# Patient Record
Sex: Female | Born: 1955
Health system: Southern US, Community
[De-identification: ages and names within clinical notes are randomized; demographics above are authoritative.]

## PROBLEM LIST (undated history)

## (undated) DIAGNOSIS — S83249A Other tear of medial meniscus, current injury, unspecified knee, initial encounter: Secondary | ICD-10-CM

## (undated) DIAGNOSIS — K219 Gastro-esophageal reflux disease without esophagitis: Secondary | ICD-10-CM

## (undated) DIAGNOSIS — C801 Malignant (primary) neoplasm, unspecified: Secondary | ICD-10-CM

## (undated) DIAGNOSIS — M8580 Other specified disorders of bone density and structure, unspecified site: Secondary | ICD-10-CM

## (undated) DIAGNOSIS — C8190 Hodgkin lymphoma, unspecified, unspecified site: Secondary | ICD-10-CM

## (undated) DIAGNOSIS — Z79899 Other long term (current) drug therapy: Secondary | ICD-10-CM

## (undated) DIAGNOSIS — M810 Age-related osteoporosis without current pathological fracture: Secondary | ICD-10-CM

## (undated) DIAGNOSIS — M1710 Unilateral primary osteoarthritis, unspecified knee: Secondary | ICD-10-CM

## (undated) DIAGNOSIS — J302 Other seasonal allergic rhinitis: Secondary | ICD-10-CM

## (undated) DIAGNOSIS — M25561 Pain in right knee: Secondary | ICD-10-CM

## (undated) DIAGNOSIS — R1310 Dysphagia, unspecified: Secondary | ICD-10-CM

## (undated) DIAGNOSIS — M545 Low back pain: Secondary | ICD-10-CM

## (undated) DIAGNOSIS — H6592 Unspecified nonsuppurative otitis media, left ear: Secondary | ICD-10-CM

## (undated) DIAGNOSIS — R079 Chest pain, unspecified: Secondary | ICD-10-CM

## (undated) HISTORY — DX: Dysphagia, unspecified: R13.10

## (undated) HISTORY — DX: Other seasonal allergic rhinitis: J30.2

## (undated) HISTORY — DX: Hodgkin lymphoma, unspecified, unspecified site: C81.90

## (undated) HISTORY — PX: KNEE ARTHROSCOPY: SUR90

## (undated) HISTORY — PX: SALIVARY GLAND SURGERY: SHX768

## (undated) HISTORY — PX: POLYPECTOMY: SHX149

## (undated) HISTORY — PX: NECK LESION BIOPSY: SHX2078

## (undated) HISTORY — DX: Chest pain, unspecified: R07.9

## (undated) HISTORY — DX: Other tear of medial meniscus, current injury, unspecified knee, initial encounter: S83.249A

## (undated) HISTORY — DX: Low back pain: M54.5

## (undated) HISTORY — DX: Unilateral primary osteoarthritis, unspecified knee: M17.10

## (undated) HISTORY — DX: Pain in right knee: M25.561

## (undated) HISTORY — PX: CHOLECYSTECTOMY: SHX55

## (undated) HISTORY — DX: Other long term (current) drug therapy: Z79.899

## (undated) HISTORY — DX: Unspecified nonsuppurative otitis media, left ear: H65.92

## (undated) HISTORY — DX: Age-related osteoporosis without current pathological fracture: M81.0

## (undated) HISTORY — DX: Gastro-esophageal reflux disease without esophagitis: K21.9

## (undated) HISTORY — DX: Other specified disorders of bone density and structure, unspecified site: M85.80

---
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
## 1999-09-06 ENCOUNTER — Other Ambulatory Visit: Admission: RE | Admit: 1999-09-06 | Discharge: 1999-09-06 | Payer: Self-pay | Admitting: Family Medicine

## 2000-04-09 ENCOUNTER — Ambulatory Visit (HOSPITAL_BASED_OUTPATIENT_CLINIC_OR_DEPARTMENT_OTHER): Admission: RE | Admit: 2000-04-09 | Discharge: 2000-04-10 | Payer: Self-pay | Admitting: *Deleted

## 2004-10-29 ENCOUNTER — Ambulatory Visit: Payer: Self-pay | Admitting: Family Medicine

## 2013-11-26 HISTORY — PX: KNEE ARTHROSCOPY W/ SYNOVECTOMY: SHX1887

## 2015-11-21 DIAGNOSIS — R350 Frequency of micturition: Secondary | ICD-10-CM | POA: Diagnosis not present

## 2015-11-21 DIAGNOSIS — Z1231 Encounter for screening mammogram for malignant neoplasm of breast: Secondary | ICD-10-CM | POA: Diagnosis not present

## 2015-11-21 DIAGNOSIS — M8588 Other specified disorders of bone density and structure, other site: Secondary | ICD-10-CM | POA: Diagnosis not present

## 2015-11-21 DIAGNOSIS — Z01419 Encounter for gynecological examination (general) (routine) without abnormal findings: Secondary | ICD-10-CM | POA: Diagnosis not present

## 2015-11-22 DIAGNOSIS — Z0000 Encounter for general adult medical examination without abnormal findings: Secondary | ICD-10-CM | POA: Diagnosis not present

## 2016-07-24 DIAGNOSIS — H6592 Unspecified nonsuppurative otitis media, left ear: Secondary | ICD-10-CM | POA: Diagnosis not present

## 2017-04-01 DIAGNOSIS — Z1231 Encounter for screening mammogram for malignant neoplasm of breast: Secondary | ICD-10-CM | POA: Diagnosis not present

## 2017-04-01 DIAGNOSIS — N952 Postmenopausal atrophic vaginitis: Secondary | ICD-10-CM | POA: Diagnosis not present

## 2017-04-01 DIAGNOSIS — Z01419 Encounter for gynecological examination (general) (routine) without abnormal findings: Secondary | ICD-10-CM | POA: Diagnosis not present

## 2017-04-01 DIAGNOSIS — N905 Atrophy of vulva: Secondary | ICD-10-CM | POA: Diagnosis not present

## 2017-04-01 DIAGNOSIS — M8580 Other specified disorders of bone density and structure, unspecified site: Secondary | ICD-10-CM | POA: Diagnosis not present

## 2017-05-20 DIAGNOSIS — Z780 Asymptomatic menopausal state: Secondary | ICD-10-CM | POA: Diagnosis not present

## 2017-05-20 DIAGNOSIS — M810 Age-related osteoporosis without current pathological fracture: Secondary | ICD-10-CM | POA: Diagnosis not present

## 2017-05-20 DIAGNOSIS — M8588 Other specified disorders of bone density and structure, other site: Secondary | ICD-10-CM | POA: Diagnosis not present

## 2017-05-21 DIAGNOSIS — M546 Pain in thoracic spine: Secondary | ICD-10-CM | POA: Diagnosis not present

## 2017-05-21 DIAGNOSIS — R030 Elevated blood-pressure reading, without diagnosis of hypertension: Secondary | ICD-10-CM | POA: Diagnosis not present

## 2017-05-22 ENCOUNTER — Encounter: Payer: Self-pay | Admitting: Emergency Medicine

## 2017-05-22 ENCOUNTER — Emergency Department (HOSPITAL_COMMUNITY)
Admission: EM | Admit: 2017-05-22 | Discharge: 2017-05-23 | Disposition: A | Discharge status: Home / Self Care | Payer: BLUE CROSS/BLUE SHIELD | Attending: Emergency Medicine | Admitting: Emergency Medicine

## 2017-05-22 ENCOUNTER — Emergency Department (HOSPITAL_COMMUNITY): Payer: BLUE CROSS/BLUE SHIELD

## 2017-05-22 DIAGNOSIS — M8580 Other specified disorders of bone density and structure, unspecified site: Secondary | ICD-10-CM

## 2017-05-22 DIAGNOSIS — Z79899 Other long term (current) drug therapy: Secondary | ICD-10-CM | POA: Diagnosis not present

## 2017-05-22 DIAGNOSIS — R079 Chest pain, unspecified: Secondary | ICD-10-CM | POA: Insufficient documentation

## 2017-05-22 DIAGNOSIS — Z87891 Personal history of nicotine dependence: Secondary | ICD-10-CM | POA: Diagnosis not present

## 2017-05-22 DIAGNOSIS — Z7982 Long term (current) use of aspirin: Secondary | ICD-10-CM | POA: Diagnosis not present

## 2017-05-22 DIAGNOSIS — R0789 Other chest pain: Secondary | ICD-10-CM | POA: Diagnosis not present

## 2017-05-22 HISTORY — DX: Other specified disorders of bone density and structure, unspecified site: M85.80

## 2017-05-22 HISTORY — DX: Malignant (primary) neoplasm, unspecified: C80.1

## 2017-05-22 LAB — CBC
HCT: 39.7 % (ref 36.0–46.0)
Hemoglobin: 13.3 g/dL (ref 12.0–15.0)
MCH: 30 pg (ref 26.0–34.0)
MCHC: 33.5 g/dL (ref 30.0–36.0)
MCV: 89.6 fL (ref 78.0–100.0)
Platelets: 205 10*3/uL (ref 150–400)
RBC: 4.43 MIL/uL (ref 3.87–5.11)
RDW: 14.9 % (ref 11.5–15.5)
WBC: 7.3 10*3/uL (ref 4.0–10.5)

## 2017-05-22 LAB — BASIC METABOLIC PANEL
Anion gap: 8 (ref 5–15)
BUN: 8 mg/dL (ref 6–20)
CO2: 27 mmol/L (ref 22–32)
Calcium: 9.4 mg/dL (ref 8.9–10.3)
Chloride: 103 mmol/L (ref 101–111)
Creatinine, Ser: 0.81 mg/dL (ref 0.44–1.00)
GFR calc Af Amer: >60 mL/min (ref >60)
GFR calc non Af Amer: >60 mL/min (ref >60)
Glucose, Bld: 87 mg/dL (ref 65–99)
Potassium: 3.9 mmol/L (ref 3.5–5.1)
Sodium: 138 mmol/L (ref 135–145)

## 2017-05-22 LAB — I-STAT TROPONIN, ED
Troponin i, poc: 0 ng/mL (ref 0.00–0.08)
Troponin i, poc: 0 ng/mL (ref 0.00–0.08)

## 2017-05-22 MED ORDER — HYDROXYZINE HCL 25 MG PO TABS: 25.0000 mg | ORAL_TABLET | Freq: Four times a day (QID) | ORAL | 0 refills | Status: AC | PRN

## 2017-05-22 MED ORDER — HYDROXYZINE HCL 25 MG PO TABS
25.0000 mg | ORAL_TABLET | Freq: Once | ORAL | Status: AC
  Administered 2017-05-22: 25 mg via ORAL
  Filled 2017-05-22: qty 1

## 2017-05-22 NOTE — ED Triage Notes (Signed)
Pt arrives via [facility] from UC; pt at [facility] for back pain x 3 weeks; pt also mention she had chest pain x 3 weeks but denies chest pain on arrival to UC and on arrival to ED; Per  EMS UC saw inverted T waves on EKG and suggested she come to ED;pt arrives a&ox 4; family present on arrival. Pt received 324 mg ASA at [facility]

## 2017-05-22 NOTE — ED Notes (Signed)
ED Provider at bedside. 

## 2017-05-22 NOTE — ED Provider Notes (Signed)
[facility] DEPT Provider Note   CSN: [id number] Arrival date & time: 05/22/17  1946     History   Chief Complaint Chief Complaint  Patient presents with  . Back Pain  . Chest Pain    HPI Linda Tanner is a 61 y.o. female.  61 year old female hiistory of Hodgkin's disease who presents after being seen earlier at [facility] urgent care for chest pain.  Patient describes 3 weeks of intermittent chest pain with associated upper back pain.  She denies any previous MI or cardiac stents.  No family history of cardiac disease before age 24.  She denies ever smoking.  She states she has been under a lot of stress lately with multiple sick family and friends.  She feels her stress is strongly contributory to her chest pain. She discussed with her sister, who recently underwent a left heart cath at age 59.  Sister recommended patient be evaluated.  Patient went to a funeral earlier today before being seen at urgent care.  At urgent care, she was noted to have inverted T waves without prior comparison.  She was instructed to present to ED for further evaluation.  She currently endorses mild left posterior scapular pain that radiates to her substernal region.  The history is provided by the patient, the spouse and medical records. No language interpreter was used.    Past Medical History:  Diagnosis Date  . Cancer ([place]) 1972   hodgkins    There are no active problems to display for this patient.   History reviewed. No pertinent surgical history.  OB History    No data available       Home Medications    Prior to Admission medications   Medication Sig Start Date End Date Taking? Authorizing Provider  aspirin EC 81 MG tablet Take 81 mg by mouth daily.   Yes [provider]  calcium carbonate (CALCIUM 600) 600 MG TABS tablet Take 1,200 mg by mouth 2 (two) times daily with a meal.   Yes [provider]  Chlorphen-Phenyleph-Ibuprofen (ADVIL ALLERGY & CONGESTION)  4-10-200 MG TABS Take 1 tablet by mouth daily as needed (congestion).   Yes [provider]  fexofenadine (ALLEGRA) 30 MG tablet Take 30 mg by mouth daily.   Yes [provider]  Multiple Vitamin (MULTIVITAMIN) tablet Take 1 tablet by mouth daily.   Yes [provider]  omega-3 acid ethyl esters (LOVAZA) 1 g capsule Take 1 g by mouth daily.   Yes [provider]  Potassium (POTASSIMIN PO) Take 1 tablet by mouth daily.   Yes [provider]  TURMERIC PO Take 1 tablet by mouth daily.   Yes [provider]  hydrOXYzine (ATARAX/VISTARIL) 25 MG tablet Take 1 tablet (25 mg total) by mouth every 6 (six) hours as needed for anxiety. 05/22/17   [name], MD    Family History No family history on file.  Social History Social History  Substance Use Topics  . Smoking status: Former Research scientist (life sciences)  . Smokeless tobacco: Not on file  . Alcohol use No     Allergies   Compazine [prochlorperazine edisylate] and Morphine and related   Review of Systems Review of Systems  Constitutional: Negative for chills and fever.  HENT: Negative for ear pain and sore throat.   Eyes: Negative for pain and visual disturbance.  Respiratory: Negative for cough and shortness of breath.   Cardiovascular: Positive for chest pain. Negative for palpitations.  Gastrointestinal: Negative for abdominal pain and  vomiting.  Genitourinary: Negative for dysuria and hematuria.  Musculoskeletal: Positive for back pain. Negative for arthralgias.  Skin: Negative for color change and rash.  Neurological: Negative for seizures and syncope.  All other systems reviewed and are negative.    Physical Exam Updated Vital Signs BP (!) 150/82   Pulse (!) 48   Temp 97.6 F (36.4 C) (Oral)   Resp 11   Ht 5\' 3"  (1.6 m)   Wt 79.5 kg (175 lb 4 oz)   SpO2 100%   BMI 31.04 kg/m   Physical Exam  Constitutional: She appears well-developed and well-nourished. No distress.  HENT:    Head: Normocephalic and atraumatic.  Eyes: Conjunctivae are normal.  Neck: Neck supple.  Cardiovascular: Normal rate and regular rhythm.   No murmur heard. Pulmonary/Chest: Effort normal and breath sounds normal. No respiratory distress. She has no wheezes. She has no rales. She exhibits tenderness (Mild reproducible anterior chest pain).  Abdominal: Soft. There is no tenderness.  Musculoskeletal: She exhibits no edema.  Neurological: She is alert. No cranial nerve deficit. Coordination normal.  5/5 motor strength and intact sensation in all extremities. Finger-to-nose intact bilaterally  Skin: Skin is warm and dry.  Nursing note and vitals reviewed.    ED Treatments / Results  Labs (all labs ordered are listed, but only abnormal results are displayed) Labs Reviewed  BASIC METABOLIC PANEL  CBC  I-STAT TROPONIN, ED  I-STAT TROPONIN, ED    EKG  EKG Interpretation  Date/Time:  Thursday May 22 2017 19:54:18 EDT Ventricular Rate:  59 PR Interval:  168 QRS Duration: 92 QT Interval:  434 QTC Calculation: 429 R Axis:   52 Text Interpretation:  Sinus bradycardia Septal infarct , age undetermined Abnormal ECG No previous ECGs available Confirmed by Wandra Arthurs ([id number]) on 05/22/2017 10:01:49 PM       Radiology Dg Chest 2 View  Result Date: 05/22/2017 CLINICAL DATA:  Chest pain, dizzy with heavy chest feeling for 3 weeks EXAM: CHEST  2 VIEW COMPARISON:  None FINDINGS: Borderline enlargement of cardiac silhouette. Mediastinal contours and pulmonary vascularity normal. Lungs clear. No pleural effusion or pneumothorax. Bones unremarkable. IMPRESSION: No acute abnormalities. Electronically Signed   By: Lavonia Dana M.D.   On: 05/22/2017 20:42    Procedures Procedures (including critical care time)  Medications Ordered in ED Medications  hydrOXYzine (ATARAX/VISTARIL) tablet 25 mg (25 mg Oral Given 05/22/17 2319)     Initial Impression / Assessment and Plan / ED Course  I  have reviewed the triage vital signs and the nursing notes.  Pertinent labs & imaging results that were available during my care of the patient were reviewed by me and considered in my medical decision making (see chart for details).     61 year old female who presents after earlier evaluation at urgent care for low risk chest pain. HEAR score of 3.  Multiple significant stressors likely contributing to symptoms.  EKG showing sinus bradycardia and septal infarct.  Nonspecific T wave inversions without previous comparison EKGs.  Troponins undetectable x2.  CXR showing no acute abnormalities.  CBC, BMP WNL.  Doubt ACS, aortic dissection, PE, Boerhaave's, PTX, pneumonia. Patient stable for close PCP and counselor/therapist follow-up.  Return precautions provided for worsening symptoms. Pt will f/u with PCP at first availability. Pt verbalized agreement with plan.  Pt care d/w Dr. Darl Householder  Final Clinical Impressions(s) / ED Diagnoses   Final diagnoses:  Chest pain, unspecified type    New Prescriptions Discharge Medication List  as of 05/22/2017 11:56 PM    START taking these medications   Details  hydrOXYzine (ATARAX/VISTARIL) 25 MG tablet Take 1 tablet (25 mg total) by mouth every 6 (six) hours as needed for anxiety., Starting Thu 05/22/2017, Print         [name], MD 05/23/17 [id number]    Drenda Freeze, MD 05/26/17 [id number]

## 2017-05-22 NOTE — Discharge Instructions (Addendum)
Please followup with primary doctor at first availability. Return to ED for worsening symptoms.

## 2017-05-26 DIAGNOSIS — R072 Precordial pain: Secondary | ICD-10-CM | POA: Diagnosis not present

## 2017-05-26 DIAGNOSIS — F430 Acute stress reaction: Secondary | ICD-10-CM | POA: Diagnosis not present

## 2017-05-26 DIAGNOSIS — C8190 Hodgkin lymphoma, unspecified, unspecified site: Secondary | ICD-10-CM | POA: Diagnosis not present

## 2017-05-26 DIAGNOSIS — R05 Cough: Secondary | ICD-10-CM | POA: Diagnosis not present

## 2017-05-27 DIAGNOSIS — M25561 Pain in right knee: Secondary | ICD-10-CM

## 2017-05-27 DIAGNOSIS — R0789 Other chest pain: Secondary | ICD-10-CM | POA: Insufficient documentation

## 2017-05-27 DIAGNOSIS — R071 Chest pain on breathing: Secondary | ICD-10-CM

## 2017-05-27 DIAGNOSIS — M545 Low back pain, unspecified: Secondary | ICD-10-CM

## 2017-05-27 DIAGNOSIS — R079 Chest pain, unspecified: Secondary | ICD-10-CM

## 2017-05-27 DIAGNOSIS — M810 Age-related osteoporosis without current pathological fracture: Secondary | ICD-10-CM

## 2017-05-27 DIAGNOSIS — H6592 Unspecified nonsuppurative otitis media, left ear: Secondary | ICD-10-CM

## 2017-05-27 DIAGNOSIS — S83249A Other tear of medial meniscus, current injury, unspecified knee, initial encounter: Secondary | ICD-10-CM

## 2017-05-27 DIAGNOSIS — M179 Osteoarthritis of knee, unspecified: Secondary | ICD-10-CM

## 2017-05-27 DIAGNOSIS — C8190 Hodgkin lymphoma, unspecified, unspecified site: Secondary | ICD-10-CM | POA: Insufficient documentation

## 2017-05-27 DIAGNOSIS — M1710 Unilateral primary osteoarthritis, unspecified knee: Secondary | ICD-10-CM

## 2017-05-27 HISTORY — DX: Hodgkin lymphoma, unspecified, unspecified site: C81.90

## 2017-05-27 HISTORY — DX: Osteoarthritis of knee, unspecified: M17.9

## 2017-05-27 HISTORY — DX: Unilateral primary osteoarthritis, unspecified knee: M17.10

## 2017-05-27 HISTORY — DX: Age-related osteoporosis without current pathological fracture: M81.0

## 2017-05-27 HISTORY — DX: Unspecified nonsuppurative otitis media, left ear: H65.92

## 2017-05-27 HISTORY — DX: Pain in right knee: M25.561

## 2017-05-27 HISTORY — DX: Chest pain, unspecified: R07.9

## 2017-05-27 HISTORY — DX: Other tear of medial meniscus, current injury, unspecified knee, initial encounter: S83.249A

## 2017-05-27 HISTORY — DX: Low back pain, unspecified: M54.50
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
## 2017-05-30 ENCOUNTER — Ambulatory Visit (INDEPENDENT_AMBULATORY_CARE_PROVIDER_SITE_OTHER): Payer: BLUE CROSS/BLUE SHIELD | Admitting: Cardiology

## 2017-05-30 ENCOUNTER — Encounter: Payer: Self-pay | Admitting: Cardiology

## 2017-05-30 VITALS — BP 160/98 | HR 72 | Ht 63.0 in | Wt 176.0 lb

## 2017-05-30 DIAGNOSIS — R079 Chest pain, unspecified: Secondary | ICD-10-CM | POA: Diagnosis not present

## 2017-05-30 DIAGNOSIS — C8190 Hodgkin lymphoma, unspecified, unspecified site: Secondary | ICD-10-CM

## 2017-05-30 MED ORDER — NITROGLYCERIN 0.4 MG SL SUBL: 0.4000 mg | SUBLINGUAL_TABLET | SUBLINGUAL | 11 refills | Status: DC | PRN

## 2017-05-30 MED ORDER — NITROGLYCERIN 0.3 MG SL SUBL: 0.4000 mg | SUBLINGUAL_TABLET | SUBLINGUAL | Status: DC | PRN

## 2017-05-30 MED ORDER — DILTIAZEM HCL 30 MG PO TABS: 30.0000 mg | ORAL_TABLET | Freq: Three times a day (TID) | ORAL | 3 refills | Status: DC

## 2017-05-30 NOTE — Addendum Note (Signed)
Addended by: Stevan Born on: 05/30/2017 04:37 PM   Modules accepted: Orders

## 2017-05-30 NOTE — Patient Instructions (Addendum)
Medication Instructions:  Your physician has recommended you make the following change in your medication:  START diltiazem (Cardizem) 30 mg three times daily START nitroglycerin 0.4 mg sublingual (under your tongue) as needed for chest pain. When having chest pain, stop what you are doing and sit down. Take 1 nitro, wait 5 minutes. Still having chest pain, take 1 nitro, wait 5 minutes. Still having chest pain, take 1 nitro, dial 911. Total of 3 nitro in 15 minutes.  Labwork: None  Testing/Procedures: You had an EKG today.   Your physician has requested that you have en exercise stress myoview. For further information please visit [email or web address]. Please follow instruction sheet, as given.  Follow-Up: Your physician recommends that you schedule a follow-up appointment in: 2 weeks.  Any Other Special Instructions Will Be Listed Below (If Applicable).     If you need a refill on your cardiac medications before your next appointment, please call your pharmacy.    Angina Pectoris Angina pectoris, often called angina, is extreme discomfort in the chest, neck, or arm. This is caused by a lack of blood in the middle and thickest layer of the heart wall (myocardium). There are four types of angina:  Stable angina. Stable angina usually occurs in episodes of predictable frequency and duration. It is usually brought on by physical activity, stress, or excitement. Stable angina usually lasts a few minutes and can often be relieved by a medicine that you place under your tongue. This medicine is called sublingual nitroglycerin.  Unstable angina. Unstable angina can occur even when you are doing little or no physical activity. It can even occur while you are sleeping or when you are at rest. It can suddenly increase in severity or frequency. It may not be relieved by sublingual nitroglycerin, and it can last up to 30 minutes.  Microvascular angina. This type of angina is caused by a disorder  of tiny blood vessels called arterioles. Microvascular angina is more common in women. The pain may be more severe and last longer than other types of angina pectoris.  Prinzmetal or variant angina. This type of angina pectoris is rare and usually occurs when you are doing little or no physical activity. It especially occurs in the early morning hours.  What are the causes? Atherosclerosis is the cause of angina. This is the buildup of fat and cholesterol (plaque) on the inside of the arteries. Over time, the plaque may narrow or block the artery, and this will lessen blood flow to the heart. Plaque can also become weak and break off within a coronary artery to form a clot and cause a sudden blockage. What increases the risk? Risk factors common to both men and women include:  High cholesterol levels.  High blood pressure (hypertension).  Tobacco use.  Diabetes.  Family history of angina.  Obesity.  Lack of exercise.  A diet high in saturated fats.  Women are at greater risk for angina if they are:  Over age 80.  Postmenopausal.  What are the signs or symptoms? Many people do not experience any symptoms during the early stages of angina. As the condition progresses, symptoms common to both men and women may include:  Chest pain. ? The pain can be described as a crushing or squeezing in the chest, or a tightness, pressure, fullness, or heaviness in the chest. ? The pain can last more than a few minutes, or it can stop and recur.  Pain in the arms, neck, jaw, or back.  Unexplained heartburn or indigestion.  Shortness of breath.  Nausea.  Sudden cold sweats.  Sudden light-headedness.  Many women have chest discomfort and some of the other symptoms. However, women often have different (atypical) symptoms, such as:  Fatigue.  Unexplained feelings of nervousness or anxiety.  Unexplained weakness.  Dizziness or fainting.  Sometimes, women may have angina without  any symptoms. How is this diagnosed? Tests to diagnose angina may include:  ECG (electrocardiogram).  Exercise stress test. This looks for signs of blockage when the heart is being exercised.  Pharmacologic stress test. This test looks for signs of blockage when the heart is being stressed with a medicine.  Blood tests.  Coronary angiogram. This is a procedure to look at the coronary arteries to see if there is any blockage.  How is this treated? The treatment of angina may include the following:  Healthy behavioral changes to reduce or control risk factors.  Medicine.  Coronary stenting.A stent helps to keep an artery open.  Coronary angioplasty. This procedure widens a narrowed or blocked artery.  Coronary arterybypass surgery. This will allow your blood to pass the blockage (bypass) to reach your heart.  Follow these instructions at home:  Take medicines only as directed by your health care provider.  Do not take the following medicines unless your health care provider approves: ? Nonsteroidal anti-inflammatory drugs (NSAIDs), such as ibuprofen, naproxen, or celecoxib. ? Vitamin supplements that contain vitamin A, vitamin E, or both. ? Hormone replacement therapy that contains estrogen with or without progestin.  Manage other health conditions such as hypertension and diabetes as directed by your health care provider.  Follow a heart-healthy diet. A dietitian can help to educate you about healthy food options and changes.  Use healthy cooking methods such as roasting, grilling, broiling, baking, poaching, steaming, or stir-frying. Talk to a dietitian to learn more about healthy cooking methods.  Follow an exercise program approved by your health care provider.  Maintain a healthy weight. Lose weight as approved by your health care provider.  Plan rest periods when fatigued.  Learn to manage stress.  Do not use any tobacco products, including cigarettes, chewing  tobacco, or electronic cigarettes. If you need help quitting, ask your health care provider.  If you drink alcohol, and your health care provider approves, limit your alcohol intake to no more than 1 drink per day. One drink equals 12 ounces of beer, 5 ounces of wine, or 1 ounces of hard liquor.  Stop illegal drug use.  Keep all follow-up visits as directed by your health care provider. This is important. Get help right away if:  You have pain in your chest, neck, arm, jaw, stomach, or back that lasts more than a few minutes, is recurring, or is unrelieved by taking sublingualnitroglycerin.  You have profuse sweating without cause.  You have unexplained: ? Heartburn or indigestion. ? Shortness of breath or difficulty breathing. ? Nausea or vomiting. ? Fatigue. ? Feelings of nervousness or anxiety. ? Weakness. ? Diarrhea.  You have sudden light-headedness or dizziness.  You faint. These symptoms may represent a serious problem that is an emergency. Do not wait to see if the symptoms will go away. Get medical help right away. Call your local emergency services (911 in the [place] Do not drive yourself to the hospital. This information is not intended to replace advice given to you by your health care provider. Make sure you discuss any questions you have with your health care provider. Document Released:  07/29/2005 Document Revised: 01/10/2016 Document Reviewed: 11/30/2013 Elsevier Interactive Patient Education  2017 [email or web address]  Cholesterol Cholesterol is a fat. Your body needs a small amount of cholesterol. Cholesterol (plaque) may build up in your blood vessels (arteries). That makes you more likely to have a heart attack or stroke. You cannot feel your cholesterol level. Having a blood test is the only way to find out if your level is high. Keep your test results. Work with your doctor to keep your cholesterol at a good level. What do the results mean?  Total cholesterol  is how much cholesterol is in your blood.  LDL is bad cholesterol. This is the type that can build up. Try to have low LDL.  HDL is good cholesterol. It cleans your blood vessels and carries LDL away. Try to have high HDL.  Triglycerides are fat that the body can store or burn for energy. What are good levels of cholesterol?  Total cholesterol below 200.  LDL below 100 is good for people who have health risks. LDL below 70 is good for people who have very high risks.  HDL above 40 is good. It is best to have HDL of 60 or higher.  Triglycerides below 150. How can I lower my cholesterol? Diet Follow your diet program as told by your doctor.  Choose fish, white meat chicken, or [place] that is roasted or baked. Try not to eat red meat, fried foods, sausage, or lunch meats.  Eat lots of fresh fruits and vegetables.  Choose whole grains, beans, pasta, potatoes, and cereals.  Choose olive oil, corn oil, or canola oil. Only use small amounts.  Try not to eat butter, mayonnaise, shortening, or palm kernel oils.  Try not to eat foods with trans fats.  Choose low-fat or nonfat dairy foods. ? Drink skim or nonfat milk. ? Eat low-fat or nonfat yogurt and cheeses. ? Try not to drink whole milk or cream. ? Try not to eat ice cream, egg yolks, or full-fat cheeses.  Healthy desserts include angel food cake, ginger snaps, animal crackers, hard candy, popsicles, and low-fat or nonfat frozen yogurt. Try not to eat pastries, cakes, pies, and cookies.  Exercise Follow your exercise program as told by your doctor.  Be more active. Try gardening, walking, and taking the stairs.  Ask your doctor about ways that you can be more active.  Medicine  Take over-the-counter and prescription medicines only as told by your doctor.  This information is not intended to replace advice given to you by your health care provider. Make sure you discuss any questions you have with your health care  provider. Document Released: 10/25/2008 Document Revised: 02/28/2016 Document Reviewed: 02/08/2016 Elsevier Interactive Patient Education  2017 [employer].

## 2017-06-02 ENCOUNTER — Ambulatory Visit (HOSPITAL_COMMUNITY): Payer: BLUE CROSS/BLUE SHIELD | Attending: Cardiology

## 2017-06-02 VITALS — Ht 63.0 in | Wt 176.0 lb

## 2017-06-02 DIAGNOSIS — R079 Chest pain, unspecified: Secondary | ICD-10-CM | POA: Diagnosis not present

## 2017-06-02 DIAGNOSIS — R9431 Abnormal electrocardiogram [ECG] [EKG]: Secondary | ICD-10-CM

## 2017-06-02 LAB — MYOCARDIAL PERFUSION IMAGING
LV dias vol: 84 mL (ref 46–106)
LV sys vol: 23 mL
Peak HR: 85 {beats}/min
RATE: 0.3
Rest HR: 59 {beats}/min
SDS: 3
SRS: 2
SSS: 5
TID: 0.95

## 2017-06-02 MED ORDER — TECHNETIUM TC 99M TETROFOSMIN IV KIT
31.7000 | PACK | Freq: Once | INTRAVENOUS | Status: AC | PRN
  Administered 2017-06-02: 31.7 via INTRAVENOUS
  Filled 2017-06-02: qty 32

## 2017-06-02 MED ORDER — TECHNETIUM TC 99M TETROFOSMIN IV KIT
9.6000 | PACK | Freq: Once | INTRAVENOUS | Status: AC | PRN
  Administered 2017-06-02: 9.6 via INTRAVENOUS
  Filled 2017-06-02: qty 10

## 2017-06-02 MED ORDER — REGADENOSON 0.4 MG/5ML IV SOLN
0.4000 mg | Freq: Once | INTRAVENOUS | Status: AC
  Administered 2017-06-02: 0.4 mg via INTRAVENOUS

## 2017-06-12 DIAGNOSIS — Z1211 Encounter for screening for malignant neoplasm of colon: Secondary | ICD-10-CM | POA: Diagnosis not present

## 2017-06-12 DIAGNOSIS — Z23 Encounter for immunization: Secondary | ICD-10-CM | POA: Diagnosis not present

## 2017-06-12 DIAGNOSIS — Z1389 Encounter for screening for other disorder: Secondary | ICD-10-CM | POA: Diagnosis not present

## 2017-06-12 DIAGNOSIS — E669 Obesity, unspecified: Secondary | ICD-10-CM | POA: Diagnosis not present

## 2017-06-12 DIAGNOSIS — Z0000 Encounter for general adult medical examination without abnormal findings: Secondary | ICD-10-CM | POA: Diagnosis not present

## 2017-06-12 DIAGNOSIS — Z1331 Encounter for screening for depression: Secondary | ICD-10-CM | POA: Diagnosis not present
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
## 2017-06-13 ENCOUNTER — Encounter: Payer: Self-pay | Admitting: Cardiology

## 2017-06-13 ENCOUNTER — Ambulatory Visit (INDEPENDENT_AMBULATORY_CARE_PROVIDER_SITE_OTHER): Payer: BLUE CROSS/BLUE SHIELD | Admitting: Cardiology

## 2017-06-13 VITALS — BP 140/70 | HR 67 | Ht 63.0 in | Wt 181.0 lb

## 2017-06-13 DIAGNOSIS — R071 Chest pain on breathing: Secondary | ICD-10-CM

## 2017-06-13 DIAGNOSIS — I10 Essential (primary) hypertension: Secondary | ICD-10-CM

## 2017-06-13 DIAGNOSIS — R0789 Other chest pain: Secondary | ICD-10-CM

## 2017-06-13 HISTORY — DX: Essential (primary) hypertension: I10

## 2017-06-13 MED ORDER — CELECOXIB 200 MG PO CAPS: 200.0000 mg | ORAL_CAPSULE | Freq: Two times a day (BID) | ORAL | 1 refills | Status: DC

## 2017-06-13 NOTE — Patient Instructions (Addendum)
Medication Instructions:  Your physician has recommended you make the following change in your medication:  START celecoxib (Celebrex) 200 mg twice daily for 2 weeks and continue to 4 weeks if needed.  Labwork: None  Testing/Procedures: None  Follow-Up: Your physician recommends that you schedule a follow-up appointment as needed if symptoms worsen or fail to improve.  Any Other Special Instructions Will Be Listed Below (If Applicable).     If you need a refill on your cardiac medications before your next appointment, please call your pharmacy.    Costochondritis Costochondritis is swelling and irritation (inflammation) of the tissue (cartilage) that connects your ribs to your breastbone (sternum). This causes pain in the front of your chest. The pain usually starts gradually and involves more than one rib. What are the causes? The exact cause of this condition is not always known. It results from stress on the cartilage where your ribs attach to your sternum. The cause of this stress could be:  Chest injury (trauma).  Exercise or activity, such as lifting.  Severe coughing.  What increases the risk? You may be at higher risk for this condition if you:  Are female.  Are 2?61 years old.  Recently started a new exercise or work activity.  Have low levels of vitamin D.  Have a condition that makes you cough frequently.  What are the signs or symptoms? The main symptom of this condition is chest pain. The pain:  Usually starts gradually and can be sharp or dull.  Gets worse with deep breathing, coughing, or exercise.  Gets better with rest.  May be worse when you press on the sternum-rib connection (tenderness).  How is this diagnosed? This condition is diagnosed based on your symptoms, medical history, and a physical exam. Your health care provider will check for tenderness when pressing on your sternum. This is the most important finding. You may also have  tests to rule out other causes of chest pain. These may include:  A chest X-ray to check for lung problems.  An electrocardiogram (ECG) to see if you have a heart problem that could be causing the pain.  An imaging scan to rule out a chest or rib fracture.  How is this treated? This condition usually goes away on its own over time. Your health care provider may prescribe an NSAID to reduce pain and inflammation. Your health care provider may also suggest that you:  Rest and avoid activities that make pain worse.  Apply heat or cold to the area to reduce pain and inflammation.  Do exercises to stretch your chest muscles.  If these treatments do not help, your health care provider may inject a numbing medicine at the sternum-rib connection to help relieve the pain. Follow these instructions at home:  Avoid activities that make pain worse. This includes any activities that use chest, abdominal, and side muscles.  If directed, put ice on the painful area: ? Put ice in a plastic bag. ? Place a towel between your skin and the bag. ? Leave the ice on for 20 minutes, 2-3 times a day.  If directed, apply heat to the affected area as often as told by your health care provider. Use the heat source that your health care provider recommends, such as a moist heat pack or a heating pad. ? Place a towel between your skin and the heat source. ? Leave the heat on for 20-30 minutes. ? Remove the heat if your skin turns bright red. This  is especially important if you are unable to feel pain, heat, or cold. You may have a greater risk of getting burned.  Take over-the-counter and prescription medicines only as told by your health care provider.  Return to your normal activities as told by your health care provider. Ask your health care provider what activities are safe for you.  Keep all follow-up visits as told by your health care provider. This is important. Contact a health care provider  if:  You have chills or a fever.  Your pain does not go away or it gets worse.  You have a cough that does not go away (is persistent). Get help right away if:  You have shortness of breath. This information is not intended to replace advice given to you by your health care provider. Make sure you discuss any questions you have with your health care provider. Document Released: 05/08/2005 Document Revised: 02/16/2016 Document Reviewed: 11/22/2015 Elsevier Interactive Patient Education  2018 [employer].   Take celebrex for 2 weeks and if unimproved continue for a full month  DASH diet: Healthy eating to lower your blood pressure The DASH diet emphasizes portion size, eating a variety of foods and getting the right amount of nutrients. Discover how DASH can improve your health and lower your blood pressure. By [facility] Staff  DASH stands for Dietary Approaches to Stop Hypertension. The DASH diet is a lifelong approach to healthy eating that's designed to help treat or prevent high blood pressure (hypertension). The DASH diet encourages you to reduce the sodium in your diet and eat a variety of foods rich in nutrients that help lower blood pressure, such as potassium, calcium and magnesium. By following the DASH diet, you may be able to reduce your blood pressure by a few points in just two weeks. Over time, your systolic blood pressure could drop by eight to 14 points, which can make a significant difference in your health risks. Because the DASH diet is a healthy way of eating, it offers health benefits besides just lowering blood pressure. The DASH diet is also in line with dietary recommendations to prevent osteoporosis, cancer, heart disease, stroke and diabetes. DASH diet: Sodium levels The DASH diet emphasizes vegetables, fruits and low-fat dairy foods - and moderate amounts of whole grains, fish, poultry and nuts. In addition to the standard DASH diet, there is also a lower sodium  version of the diet. You can choose the version of the diet that meets your health needs: Standard DASH diet. You can consume up to 2,300 milligrams (mg) of sodium a day.  Lower sodium DASH diet. You can consume up to 1,500 mg of sodium a day. Both versions of the DASH diet aim to reduce the amount of sodium in your diet compared with what you might get in a typical American diet, which can amount to a whopping 3,400 mg of sodium a day or more. The standard DASH diet meets the recommendation from the Dietary Guidelines for Americans to keep daily sodium intake to less than 2,300 mg a day. The American Heart Association recommends 1,500 mg a day of sodium as an upper limit for all adults. If you aren't sure what sodium level is right for you, talk to your doctor. DASH diet: What to eat Both versions of the DASH diet include lots of whole grains, fruits, vegetables and low-fat dairy products. The DASH diet also includes some fish, poultry and legumes, and encourages a small amount of nuts and seeds  a few times a week.  You can eat red meat, sweets and fats in small amounts. The DASH diet is low in saturated fat, cholesterol and total fat. Here's a look at the recommended servings from each food group for the 2,000-calorie-a-day DASH diet. Grains: 6 to 8 servings a day Grains include bread, cereal, rice and pasta. Examples of one serving of grains include 1 slice whole-wheat bread, 1 ounce dry cereal, or 1/2 cup cooked cereal, rice or pasta. Focus on whole grains because they have more fiber and nutrients than do refined grains. For instance, use brown rice instead of white rice, whole-wheat pasta instead of regular pasta and whole-grain bread instead of white bread. Look for products labeled "100 percent whole grain" or "100 percent whole wheat."  Grains are naturally low in fat. Keep them this way by avoiding butter, cream and cheese sauces. Vegetables: 4 to 5 servings a day Tomatoes, carrots,  broccoli, sweet potatoes, greens and other vegetables are full of fiber, vitamins, and such minerals as potassium and magnesium. Examples of one serving include 1 cup raw leafy green vegetables or 1/2 cup cut-up raw or cooked vegetables. Don't think of vegetables only as side dishes - a hearty blend of vegetables served over brown rice or whole-wheat noodles can serve as the main dish for a meal.  Fresh and frozen vegetables are both good choices. When buying frozen and canned vegetables, choose those labeled as low sodium or without added salt.  To increase the number of servings you fit in daily, be creative. In a stir-fry, for instance, cut the amount of meat in half and double up on the vegetables. Fruits: 4 to 5 servings a day Many fruits need little preparation to become a healthy part of a meal or snack. Like vegetables, they're packed with fiber, potassium and magnesium and are typically low in fat - coconuts are an exception. Examples of one serving include one medium fruit, 1/2 cup fresh, frozen or canned fruit, or 4 ounces of juice. Have a piece of fruit with meals and one as a snack, then round out your day with a dessert of fresh fruits topped with a dollop of low-fat yogurt.  Leave on edible peels whenever possible. The peels of apples, pears and most fruits with pits add interesting texture to recipes and contain healthy nutrients and fiber.  Remember that citrus fruits and juices, such as grapefruit, can interact with certain medications, so check with your doctor or pharmacist to see if they're OK for you.  If you choose canned fruit or juice, make sure no sugar is added. Dairy: 2 to 3 servings a day Milk, yogurt, cheese and other dairy products are major sources of calcium, vitamin D and protein. But the key is to make sure that you choose dairy products that are low fat or fat-free because otherwise they can be a major source of fat - and most of it is saturated. Examples of one serving  include 1 cup skim or 1 percent milk, 1 cup low fat yogurt, or 1 1/2 ounces part-skim cheese. Low-fat or fat-free frozen yogurt can help you boost the amount of dairy products you eat while offering a sweet treat. Add fruit for a healthy twist.  If you have trouble digesting dairy products, choose lactose-free products or consider taking an over-the-counter product that contains the enzyme lactase, which can reduce or prevent the symptoms of lactose intolerance.  Go easy on regular and even fat-free cheeses because they are  typically high in sodium. Lean meat, poultry and fish: 6 servings or fewer a day Meat can be a rich source of protein, B vitamins, iron and zinc. Choose lean varieties and aim for no more than 6 ounces a day. Cutting back on your meat portion will allow room for more vegetables. Trim away skin and fat from poultry and meat and then bake, broil, grill or roast instead of frying in fat.  Eat heart-healthy fish, such as salmon, herring and tuna. These types of fish are high in omega-3 fatty acids, which can help lower your total cholesterol. Nuts, seeds and legumes: 4 to 5 servings a week Almonds, sunflower seeds, kidney beans, peas, lentils and other foods in this family are good sources of magnesium, potassium and protein. They're also full of fiber and phytochemicals, which are plant compounds that may protect against some cancers and cardiovascular disease. Serving sizes are small and are intended to be consumed only a few times a week because these foods are high in calories. Examples of one serving include 1/3 cup nuts, 2 tablespoons seeds, or 1/2 cup cooked beans or peas.  Nuts sometimes get a bad rap because of their fat content, but they contain healthy types of fat - monounsaturated fat and omega-3 fatty acids. They're high in calories, however, so eat them in moderation. Try adding them to stir-fries, salads or cereals.  Soybean-based products, such as tofu and tempeh, can be  a good alternative to meat because they contain all of the amino acids your body needs to make a complete protein, just like meat. Fats and oils: 2 to 3 servings a day Fat helps your body absorb essential vitamins and helps your body's immune system. But too much fat increases your risk of heart disease, diabetes and obesity. The DASH diet strives for a healthy balance by limiting total fat to less than 30 percent of daily calories from fat, with a focus on the healthier monounsaturated fats. Examples of one serving include 1 teaspoon soft margarine, 1 tablespoon mayonnaise or 2 tablespoons salad dressing. Saturated fat and trans fat are the main dietary culprits in increasing your risk of coronary artery disease. DASH helps keep your daily saturated fat to less than 6 percent of your total calories by limiting use of meat, butter, cheese, whole milk, cream and eggs in your diet, along with foods made from lard, solid shortenings, and palm and coconut oils.  Avoid trans fat, commonly found in such processed foods as crackers, baked goods and fried items.  Read food labels on margarine and salad dressing so that you can choose those that are lowest in saturated fat and free of trans fat. Sweets: 5 servings or fewer a week You don't have to banish sweets entirely while following the DASH diet - just go easy on them. Examples of one serving include 1 tablespoon sugar, jelly or jam, 1/2 cup sorbet, or 1 cup lemonade. When you eat sweets, choose those that are fat-free or low-fat, such as sorbets, fruit ices, jelly beans, hard candy, graham crackers or low-fat cookies.  Artificial sweeteners such as aspartame (NutraSweet, Equal) and sucralose (Splenda) may help satisfy your sweet tooth while sparing the sugar. But remember that you still must use them sensibly. It's OK to swap a diet cola for a regular cola, but not in place of a more nutritious beverage such as low-fat milk or even plain water.  Cut back on  added sugar, which has no nutritional value but can pack  on calories. DASH diet: Alcohol and caffeine Drinking too much alcohol can increase blood pressure. The Dietary Guidelines for Americans recommends that men limit alcohol to no more than two drinks a day and women to one or less. The DASH diet doesn't address caffeine consumption. The influence of caffeine on blood pressure remains unclear. But caffeine can cause your blood pressure to rise at least temporarily. If you already have high blood pressure or if you think caffeine is affecting your blood pressure, talk to your doctor about your caffeine consumption. DASH diet and weight loss While the DASH diet is not a weight-loss program, you may indeed lose unwanted pounds because it can help guide you toward healthier food choices. The DASH diet generally includes about 2,000 calories a day. If you're trying to lose weight, you may need to eat fewer calories. You may also need to adjust your serving goals based on your individual circumstances - something your health care team can help you decide. Tips to cut back on sodium The foods at the core of the DASH diet are naturally low in sodium. So just by following the DASH diet, you're likely to reduce your sodium intake. You also reduce sodium further by: Using sodium-free spices or flavorings with your food instead of salt  Not adding salt when cooking rice, pasta or hot cereal  Rinsing canned foods to remove some of the sodium  Buying foods labeled "no salt added," "sodium-free," "low sodium" or "very low sodium" One teaspoon of table salt has 2,325 mg of sodium. When you read food labels, you may be surprised at just how much sodium some processed foods contain. Even low-fat soups, canned vegetables, ready-to-eat cereals and sliced [place] from the local deli - foods you may have considered healthy - often have lots of sodium. You may notice a difference in taste when you choose low-sodium food  and beverages. If things seem too bland, gradually introduce low-sodium foods and cut back on table salt until you reach your sodium goal. That'll give your palate time to adjust. Using salt-free seasoning blends or herbs and spices may also ease the transition. It can take several weeks for your taste buds to get used to less salty foods. Putting the pieces of the DASH diet together Try these strategies to get started on the DASH diet:  Change gradually. If you now eat only one or two servings of fruits or vegetables a day, try to add a serving at lunch and one at dinner. Rather than switching to all whole grains, start by making one or two of your grain servings whole grains. Increasing fruits, vegetables and whole grains gradually can also help prevent bloating or diarrhea that may occur if you aren't used to eating a diet with lots of fiber. You can also try over-the-counter products to help reduce gas from beans and vegetables.  Reward successes and forgive slip-ups. Reward yourself with a nonfood treat for your accomplishments - rent a movie, purchase a book or get together with a friend. Everyone slips, especially when learning something new. Remember that changing your lifestyle is a long-term process. Find out what triggered your setback and then just pick up where you left off with the DASH diet.  Add physical activity. To boost your blood pressure lowering efforts even more, consider increasing your physical activity in addition to following the DASH diet. Combining both the DASH diet and physical activity makes it more likely that you'll reduce your blood pressure.  Get support  if you need it. If you're having trouble sticking to your diet, talk to your doctor or dietitian about it. You might get some tips that will help you stick to the DASH diet. Remember, healthy eating isn't an all-or-nothing proposition. What's most important is that, on average, you eat healthier foods with plenty of  variety - both to keep your diet nutritious and to avoid boredom or extremes. And with the DASH diet, you can have both.

## 2017-08-11 DIAGNOSIS — M2041 Other hammer toe(s) (acquired), right foot: Secondary | ICD-10-CM | POA: Diagnosis not present

## 2017-08-11 DIAGNOSIS — M7751 Other enthesopathy of right foot: Secondary | ICD-10-CM | POA: Diagnosis not present

## 2017-08-11 DIAGNOSIS — M19072 Primary osteoarthritis, left ankle and foot: Secondary | ICD-10-CM

## 2017-08-11 HISTORY — DX: Other enthesopathy of right foot and ankle: M77.51

## 2017-08-11 HISTORY — DX: Primary osteoarthritis, left ankle and foot: M19.072

## 2017-09-02 DIAGNOSIS — M2041 Other hammer toe(s) (acquired), right foot: Secondary | ICD-10-CM | POA: Diagnosis not present

## 2017-09-02 DIAGNOSIS — M7751 Other enthesopathy of right foot: Secondary | ICD-10-CM | POA: Diagnosis not present

## 2017-10-06 ENCOUNTER — Other Ambulatory Visit: Payer: Self-pay | Admitting: *Deleted

## 2017-10-06 DIAGNOSIS — R079 Chest pain, unspecified: Secondary | ICD-10-CM

## 2017-10-06 MED ORDER — DILTIAZEM HCL 30 MG PO TABS: 30.0000 mg | ORAL_TABLET | Freq: Three times a day (TID) | ORAL | 2 refills | Status: DC

## 2018-04-27 DIAGNOSIS — M8580 Other specified disorders of bone density and structure, unspecified site: Secondary | ICD-10-CM | POA: Diagnosis not present

## 2018-04-27 DIAGNOSIS — Z0000 Encounter for general adult medical examination without abnormal findings: Secondary | ICD-10-CM | POA: Diagnosis not present

## 2018-04-27 DIAGNOSIS — N905 Atrophy of vulva: Secondary | ICD-10-CM | POA: Diagnosis not present

## 2018-04-27 DIAGNOSIS — Z01419 Encounter for gynecological examination (general) (routine) without abnormal findings: Secondary | ICD-10-CM | POA: Diagnosis not present

## 2018-04-27 DIAGNOSIS — N952 Postmenopausal atrophic vaginitis: Secondary | ICD-10-CM | POA: Diagnosis not present

## 2018-05-11 DIAGNOSIS — Z1231 Encounter for screening mammogram for malignant neoplasm of breast: Secondary | ICD-10-CM | POA: Diagnosis not present

## 2018-08-28 DIAGNOSIS — Z8719 Personal history of other diseases of the digestive system: Secondary | ICD-10-CM | POA: Diagnosis not present

## 2018-08-28 DIAGNOSIS — Z86010 Personal history of colonic polyps: Secondary | ICD-10-CM | POA: Diagnosis not present

## 2018-08-28 DIAGNOSIS — D122 Benign neoplasm of ascending colon: Secondary | ICD-10-CM | POA: Diagnosis not present

## 2018-08-28 DIAGNOSIS — Z1211 Encounter for screening for malignant neoplasm of colon: Secondary | ICD-10-CM | POA: Diagnosis not present

## 2018-08-28 DIAGNOSIS — K635 Polyp of colon: Secondary | ICD-10-CM | POA: Diagnosis not present

## 2018-10-27 ENCOUNTER — Other Ambulatory Visit: Payer: Self-pay

## 2018-10-27 DIAGNOSIS — R079 Chest pain, unspecified: Secondary | ICD-10-CM

## 2018-10-27 MED ORDER — DILTIAZEM HCL 30 MG PO TABS: 30.0000 mg | ORAL_TABLET | Freq: Three times a day (TID) | ORAL | 1 refills | Status: DC

## 2018-11-19 ENCOUNTER — Other Ambulatory Visit: Payer: Self-pay | Admitting: Cardiology

## 2018-11-19 DIAGNOSIS — R079 Chest pain, unspecified: Secondary | ICD-10-CM

## 2018-12-13 ENCOUNTER — Other Ambulatory Visit: Payer: Self-pay | Admitting: Cardiology

## 2018-12-13 DIAGNOSIS — R079 Chest pain, unspecified: Secondary | ICD-10-CM

## 2018-12-15 NOTE — Telephone Encounter (Signed)
Diltiazem refill sent to [facility] on [address] In [place] per pt request

## 2019-01-14 ENCOUNTER — Other Ambulatory Visit: Payer: Self-pay | Admitting: Cardiology

## 2019-01-14 DIAGNOSIS — R079 Chest pain, unspecified: Secondary | ICD-10-CM

## 2019-04-30 DIAGNOSIS — F419 Anxiety disorder, unspecified: Secondary | ICD-10-CM | POA: Diagnosis not present

## 2019-04-30 DIAGNOSIS — R2230 Localized swelling, mass and lump, unspecified upper limb: Secondary | ICD-10-CM | POA: Diagnosis not present

## 2019-04-30 DIAGNOSIS — Z1331 Encounter for screening for depression: Secondary | ICD-10-CM | POA: Diagnosis not present

## 2019-04-30 DIAGNOSIS — I10 Essential (primary) hypertension: Secondary | ICD-10-CM | POA: Diagnosis not present

## 2019-05-11 DIAGNOSIS — R2230 Localized swelling, mass and lump, unspecified upper limb: Secondary | ICD-10-CM | POA: Diagnosis not present

## 2019-05-11 DIAGNOSIS — R2231 Localized swelling, mass and lump, right upper limb: Secondary | ICD-10-CM | POA: Diagnosis not present

## 2019-06-23 DIAGNOSIS — Z1231 Encounter for screening mammogram for malignant neoplasm of breast: Secondary | ICD-10-CM | POA: Diagnosis not present

## 2019-06-29 DIAGNOSIS — Z6831 Body mass index (BMI) 31.0-31.9, adult: Secondary | ICD-10-CM | POA: Diagnosis not present

## 2019-06-29 DIAGNOSIS — Z13220 Encounter for screening for lipoid disorders: Secondary | ICD-10-CM | POA: Diagnosis not present

## 2019-06-29 DIAGNOSIS — Z0000 Encounter for general adult medical examination without abnormal findings: Secondary | ICD-10-CM | POA: Diagnosis not present

## 2019-07-11 IMAGING — NM NM MISC PROCEDURE
6 series · 36 of 36 positions shown · non-contrast
Comparison: none

[Series 1: rest · 6.51mm/px · 6 of 64 frames shown]
[frame 6/64]
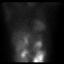
[frame 16/64]
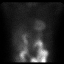
[frame 27/64]
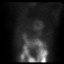
[frame 38/64]
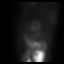
[frame 48/64]
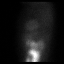
[frame 59/64]
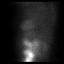

[Series 1: wbr_r-proj_st rest · 6.51mm/px · 6 of 64 frames shown]
[frame 6/64]
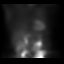
[frame 16/64]
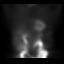
[frame 27/64]
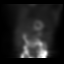
[frame 38/64]
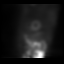
[frame 48/64]
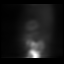
[frame 59/64]
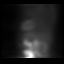

[Series 2: stress · 6.51mm/px · 6 of 512 frames shown (1 of 2)]
[frame 43/512]
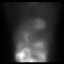
[frame 128/512]
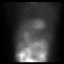
[frame 214/512]
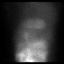
[frame 299/512]
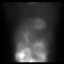
[frame 384/512]
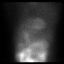
[frame 470/512]
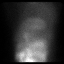

[Series 2: wbr_s-proj_st stress · 6.51mm/px · 6 of 64 frames shown (1 of 2)]
[frame 6/64]
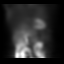
[frame 16/64]
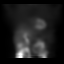
[frame 27/64]
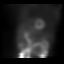
[frame 38/64]
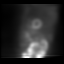
[frame 48/64]
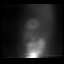
[frame 59/64]
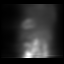

[Series 2: wbr_s-proj_st stress · 6.51mm/px · 6 of 512 frames shown (2 of 2)]
[frame 43/512]
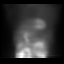
[frame 128/512]
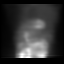
[frame 214/512]
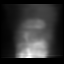
[frame 299/512]
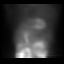
[frame 384/512]
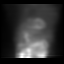
[frame 470/512]
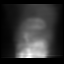

[Series 2: stress · 6.51mm/px · 6 of 64 frames shown (2 of 2)]
[frame 6/64]
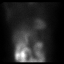
[frame 16/64]
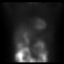
[frame 27/64]
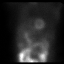
[frame 38/64]
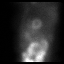
[frame 48/64]
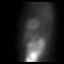
[frame 59/64]
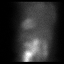

[36 of 36 positions shown; findings below may reference images not displayed]

Canned report from images found in remote index.

Refer to host system for actual result text.

## 2019-12-27 DIAGNOSIS — I10 Essential (primary) hypertension: Secondary | ICD-10-CM | POA: Diagnosis not present

## 2019-12-27 DIAGNOSIS — Z6831 Body mass index (BMI) 31.0-31.9, adult: Secondary | ICD-10-CM | POA: Diagnosis not present

## 2019-12-27 DIAGNOSIS — R739 Hyperglycemia, unspecified: Secondary | ICD-10-CM | POA: Diagnosis not present

## 2019-12-27 DIAGNOSIS — R05 Cough: Secondary | ICD-10-CM | POA: Diagnosis not present

## 2019-12-27 DIAGNOSIS — R0600 Dyspnea, unspecified: Secondary | ICD-10-CM | POA: Diagnosis not present

## 2019-12-27 DIAGNOSIS — F419 Anxiety disorder, unspecified: Secondary | ICD-10-CM | POA: Diagnosis not present

## 2020-02-02 DIAGNOSIS — I10 Essential (primary) hypertension: Secondary | ICD-10-CM | POA: Diagnosis not present

## 2020-02-02 DIAGNOSIS — R0600 Dyspnea, unspecified: Secondary | ICD-10-CM | POA: Diagnosis not present

## 2020-02-02 DIAGNOSIS — F419 Anxiety disorder, unspecified: Secondary | ICD-10-CM | POA: Diagnosis not present

## 2020-02-02 DIAGNOSIS — R0789 Other chest pain: Secondary | ICD-10-CM | POA: Diagnosis not present

## 2020-02-25 ENCOUNTER — Encounter: Payer: Self-pay | Admitting: Cardiology

## 2020-03-01 NOTE — Progress Notes (Signed)
Cardiology Office Note:    Date:  03/02/2020   ID:  Linda Tanner, DOB 1956-01-12, MRN [id number]  PCP:  Nicoletta Dress, MD  Cardiologist:  Shirlee More, MD   Referring MD: Renaldo Reel, [place]  ASSESSMENT:    1. Chest pain of uncertain etiology   2. Costochondral chest pain   3. History of Hodgkin's lymphoma   4. Shortness of breath    PLAN:    In order of problems listed above:  1. Her previous costochondral chest pain has resolved.  Her symptoms are different now and asked exertional chest discomfort and breathlessness.  She is at high risk with her previous lymphoma at this time undergo further evaluation cardiac CTA to define the presence or absence of CAD and severity and evaluation for shortness of breath with proBNP and echocardiogram.  Also asked her to have a chest x-ray for completeness and see her back in 6 weeks  Next appointment 6 weeks  Medication Adjustments/Labs and Tests Ordered: Current medicines are reviewed at length with the patient today.  Concerns regarding medicines are outlined above.  No orders of the defined types were placed in this encounter.  No orders of the defined types were placed in this encounter.    Chief complaint chest pain and shortness of breath  History of Present Illness:    Linda Tanner is a 64 y.o. female who is being seen today for the evaluation of chest pain at the request of Renaldo Reel, [place].  I had seen her 06/13/2017 medically she had typical costochondral pain syndrome with reproducible chest wall pain.  At that time she was placed on nonsteroidal anti-inflammatory drug.  Other problems include hypertension background history of Hodgkin's lymphoma.  When I had seen her previously she had very typical costochondral pain syndrome treated with a nonsteroidal inflammatory drug and resolved.  Recently her symptoms are different she notices shortness of breath with strenuous activity like sexual intercourse but also when  this occurs she seems to wheeze at times and she gets a little tightness in her chest.  The symptoms have slowly progressed over the last months.  She is at high risk with Hodgkin's lymphoma survivor and will undergo further evaluation for CAD with a cardiac CTA heart failure with echocardiogram and a proBNP level and see back in the office in 6 weeks.  She does not have edema orthopnea palpitation or syncope she has had her Covid immunization no fever chills or sputum production.  06/02/2017: Study Highlights  Nuclear stress EF: 73%.  There was no ST segment deviation noted during stress.  The study is normal.  The left ventricular ejection fraction is hyperdynamic (>65%). 1. EF 73%, normal wall motion.  2. No significant perfusion defect, no evidence for ischemia or infarction.     Past Medical History:  Diagnosis Date  . Cancer ([place]) 1972   hodgkins  . Chest pain 05/27/2017  . Hodgkin's lymphoma ([place]) 05/27/2017   1972  . Left otitis media with effusion 05/27/2017  . Low back pain 05/27/2017  . Medial meniscus tear 05/27/2017  . Osteoarthrosis of knee 05/27/2017  . Osteopenia determined by x-ray 05/22/2017   Overview:  Osteoporosis in the past but improved. Went off fosmax about 2014. 2018 BMD showed spine -2.1. Repeat BMD in 2020.   . Osteoporosis 05/27/2017  . Pain in right knee 05/27/2017  . Seasonal allergies     Past Surgical History:  Procedure Laterality Date  . CHOLECYSTECTOMY    .  KNEE ARTHROSCOPY Left   . KNEE ARTHROSCOPY W/ SYNOVECTOMY Right 11/26/2013   with partial medial meniscectomy, extensive synovectomy, chondroplasty of the patellofemoral joint  . NECK LESION BIOPSY     lump removed on gland in neck    Current Medications: Current Meds  Medication Sig  . calcium carbonate (CALCIUM 600) 600 MG TABS tablet Take 1,200 mg by mouth 2 (two) times daily with a meal.  . cholecalciferol (VITAMIN D3) 25 MCG (1000 UNIT) tablet Take 1,000 Units by mouth  daily.  Marland Kitchen CINNAMON PO Take 1 capsule by mouth daily.  . Cyanocobalamin (VITAMIN B 12 PO) Take 100 mg by mouth daily.  Marland Kitchen diltiazem (CARDIZEM) 30 MG tablet TAKE 1 TABLET (30 MG TOTAL) BY MOUTH 3 (THREE) TIMES DAILY. (Patient taking differently: Take 30 mg by mouth daily. )  . fexofenadine (ALLEGRA) 180 MG tablet Take 180 mg by mouth daily.  . hydrOXYzine (ATARAX/VISTARIL) 25 MG tablet Take 1 tablet (25 mg total) by mouth every 6 (six) hours as needed for anxiety.  Marland Kitchen omega-3 acid ethyl esters (LOVAZA) 1 g capsule Take 1 g by mouth daily.  Marland Kitchen omeprazole (PRILOSEC) 40 MG capsule Take 40 mg by mouth daily.  . Potassium 99 MG TABS Take 99 mg by mouth daily.  . TURMERIC PO Take 500 mg by mouth daily.      Allergies:   Compazine [prochlorperazine edisylate], Morphine and related, and Other   Social History   Socioeconomic History  . Marital status: Married    Spouse name: Not on file  . Number of children: Not on file  . Years of education: Not on file  . Highest education level: Not on file  Occupational History  . Not on file  Tobacco Use  . Smoking status: Former Research scientist (life sciences)  . Smokeless tobacco: Never Used  Substance and Sexual Activity  . Alcohol use: No  . Drug use: No  . Sexual activity: Not on file  Other Topics Concern  . Not on file  Social History Narrative  . Not on file   Social Determinants of Health   Financial Resource Strain:   . Difficulty of Paying Living Expenses:   Food Insecurity:   . Worried About Charity fundraiser in the Last Year:   . Arboriculturist in the Last Year:   Transportation Needs:   . Film/video editor (Medical):   Marland Kitchen Lack of Transportation (Non-Medical):   Physical Activity:   . Days of Exercise per Week:   . Minutes of Exercise per Session:   Stress:   . Feeling of Stress :   Social Connections:   . Frequency of Communication with Friends and Family:   . Frequency of Social Gatherings with Friends and Family:   . Attends Religious  Services:   . Active Member of Clubs or Organizations:   . Attends Archivist Meetings:   Marland Kitchen Marital Status:      Family History: The patient's family history includes Brain cancer in her father; Hyperlipidemia in her mother; Hypertension in her mother.    EKGs/Labs/Other Studies Reviewed:    The following studies were reviewed today:   EKG:  EKG is  ordered today.  The ekg ordered today is personally reviewed and demonstrates sinus rhythm and is normal  Recent Labs: 06/29/2019 CMP normal creatinine 0.86 GFR 72 cc/min potassium 4.5 hemoglobin 13.9 TSH normal 1.56 iron studies normal Recent Lipid Panel 06/29/2019 cholesterol 187 LDL 106 triglycerides 93 HDL 64  Physical  Exam:    VS:  BP 126/84   Pulse 62   Ht 5\' 4"  (1.626 m)   Wt 187 lb (84.8 kg)   SpO2 97%   BMI 32.10 kg/m     Wt Readings from Last 3 Encounters:  03/02/20 187 lb (84.8 kg)  02/02/20 188 lb (85.3 kg)  06/13/17 181 lb (82.1 kg)     GEN:  Well nourished, well developed in no acute distress HEENT: Normal NECK: No JVD; No carotid bruits LYMPHATICS: No lymphadenopathy CARDIAC: RRR, no murmurs, rubs, gallops RESPIRATORY:  Clear to auscultation without rales, wheezing or rhonchi  ABDOMEN: Soft, non-tender, non-distended MUSCULOSKELETAL:  No edema; No deformity  SKIN: Warm and dry NEUROLOGIC:  Alert and oriented x 3 PSYCHIATRIC:  Normal affect     Signed, Shirlee More, MD  03/02/2020 4:18 PM     [facility]

## 2020-03-02 ENCOUNTER — Encounter: Payer: Self-pay | Admitting: Cardiology

## 2020-03-02 ENCOUNTER — Other Ambulatory Visit: Payer: Self-pay

## 2020-03-02 ENCOUNTER — Ambulatory Visit (INDEPENDENT_AMBULATORY_CARE_PROVIDER_SITE_OTHER): Payer: BC Managed Care – PPO | Admitting: Cardiology

## 2020-03-02 VITALS — BP 126/84 | HR 62 | Ht 64.0 in | Wt 187.0 lb

## 2020-03-02 DIAGNOSIS — R079 Chest pain, unspecified: Secondary | ICD-10-CM

## 2020-03-02 DIAGNOSIS — R0602 Shortness of breath: Secondary | ICD-10-CM | POA: Diagnosis not present

## 2020-03-02 DIAGNOSIS — Z8571 Personal history of Hodgkin lymphoma: Secondary | ICD-10-CM

## 2020-03-02 DIAGNOSIS — R0789 Other chest pain: Secondary | ICD-10-CM

## 2020-03-02 MED ORDER — METOPROLOL TARTRATE 100 MG PO TABS
100.0000 mg | ORAL_TABLET | Freq: Once | ORAL | 0 refills | Status: DC
Start: 2020-03-02 — End: 2022-09-06

## 2020-03-02 NOTE — Patient Instructions (Addendum)
Medication Instructions:  Your physician recommends that you continue on your current medications as directed. Please refer to the Current Medication list given to you today.  *If you need a refill on your cardiac medications before your next appointment, please call your pharmacy*   Lab Work: Your physician recommends that you return for lab work in: TODAY BMP, ProBNP  Within one week of your cardiac CT: BMP   If you have labs (blood work) drawn today and your tests are completely normal, you will receive your results only by: Marland Kitchen MyChart Message (if you have MyChart) OR . A paper copy in the mail If you have any lab test that is abnormal or we need to change your treatment, we will call you to review the results.   Testing/Procedures: Your physician has requested that you have an echocardiogram. Echocardiography is a painless test that uses sound waves to create images of your heart. It provides your doctor with information about the size and shape of your heart and how well your heart's chambers and valves are working. This procedure takes approximately one hour. There are no restrictions for this procedure.  Your cardiac CT will be scheduled at the below location:   [facility] [address] [place], [place] [address] [phone]   If scheduled at [facility], please arrive at the [facility] main entrance of [facility] 30 minutes prior to test start time. Proceed to the [facility] Radiology Department (first floor) to check-in and test prep.  Please follow these instructions carefully (unless otherwise directed):  On the Night Before the Test: . Be sure to Drink plenty of water. . Do not consume any caffeinated/decaffeinated beverages or chocolate 12 hours prior to your test. . Do not take any antihistamines 12 hours prior to your test.  On the Day of the Test: . Drink plenty of water. Do not drink any water within one hour of the  test. . Do not eat any food 4 hours prior to the test. . You may take your regular medications prior to the test.  . Take metoprolol (Lopressor) two hours prior to test. . FEMALES- please wear underwire-free bra if available       After the Test: . Drink plenty of water. . After receiving IV contrast, you may experience a mild flushed feeling. This is normal. . On occasion, you may experience a mild rash up to 24 hours after the test. This is not dangerous. If this occurs, you can take Benadryl 25 mg and increase your fluid intake. . If you experience trouble breathing, this can be serious. If it is severe call 911 IMMEDIATELY. If it is mild, please call our office. . If you take any of these medications: Glipizide/Metformin, Avandament, Glucavance, please do not take 48 hours after completing test unless otherwise instructed.   Once we have confirmed authorization from your insurance company, we will call you to set up a date and time for your test. Based on how quickly your insurance processes prior authorizations requests, please allow up to 4 weeks to be contacted for scheduling your Cardiac CT appointment. Be advised that routine Cardiac CT appointments could be scheduled as many as 8 weeks after your provider has ordered it.  For non-scheduling related questions, please contact the cardiac imaging nurse navigator should you have any questions/concerns: Marchia Bond, Cardiac Imaging Nurse Navigator Burley Saver, Interim Cardiac Imaging Nurse [facility] Direct Office Dial: [phone]  For scheduling needs, including cancellations and rescheduling, please call Vivien Rota at [phone], option 3.      Follow-Up: At [facility], you and your health needs are our priority.  As part of our continuing mission to provide you with exceptional heart care, we have created designated Provider Care Teams.  These Care Teams include your primary Cardiologist  (physician) and Advanced Practice Providers (APPs -  Physician Assistants and Nurse Practitioners) who all work together to provide you with the care you need, when you need it.  We recommend signing up for the patient portal called "MyChart".  Sign up information is provided on this After Visit Summary.  MyChart is used to connect with patients for Virtual Visits (Telemedicine).  Patients are able to view lab/test results, encounter notes, upcoming appointments, etc.  Non-urgent messages can be sent to your provider as well.   To learn more about what you can do with MyChart, go to [email or web address].    Your next appointment:   6 week(s)  The format for your next appointment:   In Person  Provider:   Shirlee More, MD   Other Instructions

## 2020-03-03 ENCOUNTER — Telehealth: Payer: Self-pay

## 2020-03-03 LAB — BASIC METABOLIC PANEL
BUN/Creatinine Ratio: 19 (ref 12–28)
BUN: 19 mg/dL (ref 8–27)
CO2: 23 mmol/L (ref 20–29)
Calcium: 9.5 mg/dL (ref 8.7–10.3)
Chloride: 102 mmol/L (ref 96–106)
Creatinine, Ser: 0.99 mg/dL (ref 0.57–1.00)
GFR calc Af Amer: 70 mL/min/{1.73_m2} (ref >59)
GFR calc non Af Amer: 61 mL/min/{1.73_m2} (ref >59)
Glucose: 92 mg/dL (ref 65–99)
Potassium: 4.4 mmol/L (ref 3.5–5.2)
Sodium: 141 mmol/L (ref 134–144)

## 2020-03-03 LAB — PRO B NATRIURETIC PEPTIDE: NT-Pro BNP: 136 pg/mL (ref 0–287)

## 2020-03-03 NOTE — Telephone Encounter (Signed)
Spoke with patient regarding results and recommendation.  Patient verbalizes understanding and is agreeable to plan of care. Advised patient to call back with any issues or concerns.  

## 2020-03-03 NOTE — Telephone Encounter (Signed)
Left message on patients voicemail to please return our call.   

## 2020-03-03 NOTE — Telephone Encounter (Signed)
Patient called back returning Morgan's call 

## 2020-03-06 ENCOUNTER — Encounter: Payer: Self-pay | Admitting: Cardiology

## 2020-03-06 DIAGNOSIS — R0602 Shortness of breath: Secondary | ICD-10-CM | POA: Diagnosis not present

## 2020-03-06 DIAGNOSIS — R0789 Other chest pain: Secondary | ICD-10-CM | POA: Diagnosis not present

## 2020-03-06 DIAGNOSIS — I10 Essential (primary) hypertension: Secondary | ICD-10-CM | POA: Diagnosis not present

## 2020-03-24 ENCOUNTER — Other Ambulatory Visit: Payer: Self-pay

## 2020-03-24 ENCOUNTER — Ambulatory Visit (INDEPENDENT_AMBULATORY_CARE_PROVIDER_SITE_OTHER): Payer: BC Managed Care – PPO

## 2020-03-24 DIAGNOSIS — R0602 Shortness of breath: Secondary | ICD-10-CM | POA: Diagnosis not present

## 2020-03-24 DIAGNOSIS — R079 Chest pain, unspecified: Secondary | ICD-10-CM | POA: Diagnosis not present

## 2020-03-24 DIAGNOSIS — Z8571 Personal history of Hodgkin lymphoma: Secondary | ICD-10-CM | POA: Diagnosis not present

## 2020-03-24 DIAGNOSIS — R0789 Other chest pain: Secondary | ICD-10-CM

## 2020-03-24 LAB — ECHOCARDIOGRAM COMPLETE
Area-P 1/2: 2.99 cm2
S' Lateral: 2.3 cm

## 2020-03-24 NOTE — Progress Notes (Signed)
Complete echocardiogram performed.  Jimmy Roy Snuffer RDCS, RVT  

## 2020-04-06 ENCOUNTER — Ambulatory Visit: Payer: BC Managed Care – PPO | Admitting: Cardiology

## 2020-07-31 DIAGNOSIS — J069 Acute upper respiratory infection, unspecified: Secondary | ICD-10-CM | POA: Diagnosis not present

## 2020-07-31 DIAGNOSIS — R300 Dysuria: Secondary | ICD-10-CM | POA: Diagnosis not present

## 2020-08-03 DIAGNOSIS — R0789 Other chest pain: Secondary | ICD-10-CM | POA: Diagnosis not present

## 2020-08-03 DIAGNOSIS — F419 Anxiety disorder, unspecified: Secondary | ICD-10-CM | POA: Diagnosis not present

## 2020-08-03 DIAGNOSIS — Z1331 Encounter for screening for depression: Secondary | ICD-10-CM | POA: Diagnosis not present

## 2020-08-03 DIAGNOSIS — J302 Other seasonal allergic rhinitis: Secondary | ICD-10-CM | POA: Diagnosis not present

## 2020-08-03 DIAGNOSIS — Z8571 Personal history of Hodgkin lymphoma: Secondary | ICD-10-CM | POA: Diagnosis not present

## 2020-08-03 DIAGNOSIS — Z23 Encounter for immunization: Secondary | ICD-10-CM | POA: Diagnosis not present

## 2020-08-03 DIAGNOSIS — I10 Essential (primary) hypertension: Secondary | ICD-10-CM | POA: Diagnosis not present

## 2021-01-24 DIAGNOSIS — Z1231 Encounter for screening mammogram for malignant neoplasm of breast: Secondary | ICD-10-CM | POA: Diagnosis not present

## 2021-01-24 DIAGNOSIS — Z01419 Encounter for gynecological examination (general) (routine) without abnormal findings: Secondary | ICD-10-CM | POA: Diagnosis not present

## 2021-01-24 DIAGNOSIS — M810 Age-related osteoporosis without current pathological fracture: Secondary | ICD-10-CM | POA: Diagnosis not present

## 2021-01-24 DIAGNOSIS — Z1151 Encounter for screening for human papillomavirus (HPV): Secondary | ICD-10-CM | POA: Diagnosis not present

## 2021-02-08 DIAGNOSIS — R739 Hyperglycemia, unspecified: Secondary | ICD-10-CM | POA: Diagnosis not present

## 2021-02-08 DIAGNOSIS — Z8571 Personal history of Hodgkin lymphoma: Secondary | ICD-10-CM | POA: Diagnosis not present

## 2021-02-08 DIAGNOSIS — R0789 Other chest pain: Secondary | ICD-10-CM | POA: Diagnosis not present

## 2021-02-08 DIAGNOSIS — F419 Anxiety disorder, unspecified: Secondary | ICD-10-CM | POA: Diagnosis not present

## 2021-02-08 DIAGNOSIS — I10 Essential (primary) hypertension: Secondary | ICD-10-CM | POA: Diagnosis not present

## 2021-02-08 DIAGNOSIS — J302 Other seasonal allergic rhinitis: Secondary | ICD-10-CM | POA: Diagnosis not present

## 2021-08-29 DIAGNOSIS — I10 Essential (primary) hypertension: Secondary | ICD-10-CM | POA: Diagnosis not present

## 2021-08-29 DIAGNOSIS — Z1331 Encounter for screening for depression: Secondary | ICD-10-CM | POA: Diagnosis not present

## 2021-08-29 DIAGNOSIS — R0789 Other chest pain: Secondary | ICD-10-CM | POA: Diagnosis not present

## 2021-08-29 DIAGNOSIS — Z79899 Other long term (current) drug therapy: Secondary | ICD-10-CM | POA: Diagnosis not present

## 2021-08-29 DIAGNOSIS — Z9181 History of falling: Secondary | ICD-10-CM | POA: Diagnosis not present

## 2021-08-29 DIAGNOSIS — R739 Hyperglycemia, unspecified: Secondary | ICD-10-CM | POA: Diagnosis not present

## 2021-08-29 DIAGNOSIS — J302 Other seasonal allergic rhinitis: Secondary | ICD-10-CM | POA: Diagnosis not present

## 2021-08-29 DIAGNOSIS — Z6834 Body mass index (BMI) 34.0-34.9, adult: Secondary | ICD-10-CM | POA: Diagnosis not present

## 2021-08-29 DIAGNOSIS — F419 Anxiety disorder, unspecified: Secondary | ICD-10-CM | POA: Diagnosis not present

## 2021-08-29 DIAGNOSIS — Z1211 Encounter for screening for malignant neoplasm of colon: Secondary | ICD-10-CM | POA: Diagnosis not present

## 2021-08-29 DIAGNOSIS — Z8571 Personal history of Hodgkin lymphoma: Secondary | ICD-10-CM | POA: Diagnosis not present

## 2021-10-01 DIAGNOSIS — I10 Essential (primary) hypertension: Secondary | ICD-10-CM | POA: Diagnosis not present

## 2021-10-01 DIAGNOSIS — R1310 Dysphagia, unspecified: Secondary | ICD-10-CM | POA: Diagnosis not present

## 2021-10-26 DIAGNOSIS — Z86010 Personal history of colonic polyps: Secondary | ICD-10-CM | POA: Diagnosis not present

## 2021-10-26 DIAGNOSIS — D123 Benign neoplasm of transverse colon: Secondary | ICD-10-CM | POA: Diagnosis not present

## 2021-10-26 DIAGNOSIS — K317 Polyp of stomach and duodenum: Secondary | ICD-10-CM | POA: Diagnosis not present

## 2021-10-26 DIAGNOSIS — Z1211 Encounter for screening for malignant neoplasm of colon: Secondary | ICD-10-CM | POA: Diagnosis not present

## 2021-10-26 DIAGNOSIS — K635 Polyp of colon: Secondary | ICD-10-CM | POA: Diagnosis not present

## 2021-10-26 DIAGNOSIS — K222 Esophageal obstruction: Secondary | ICD-10-CM | POA: Diagnosis not present

## 2021-10-26 DIAGNOSIS — D122 Benign neoplasm of ascending colon: Secondary | ICD-10-CM | POA: Diagnosis not present

## 2021-10-26 DIAGNOSIS — R1310 Dysphagia, unspecified: Secondary | ICD-10-CM | POA: Diagnosis not present

## 2021-12-22 DIAGNOSIS — N644 Mastodynia: Secondary | ICD-10-CM | POA: Diagnosis not present

## 2021-12-22 DIAGNOSIS — N6459 Other signs and symptoms in breast: Secondary | ICD-10-CM | POA: Diagnosis not present

## 2022-02-26 DIAGNOSIS — J302 Other seasonal allergic rhinitis: Secondary | ICD-10-CM | POA: Diagnosis not present

## 2022-02-26 DIAGNOSIS — E785 Hyperlipidemia, unspecified: Secondary | ICD-10-CM | POA: Diagnosis not present

## 2022-02-26 DIAGNOSIS — F419 Anxiety disorder, unspecified: Secondary | ICD-10-CM | POA: Diagnosis not present

## 2022-02-26 DIAGNOSIS — K219 Gastro-esophageal reflux disease without esophagitis: Secondary | ICD-10-CM | POA: Diagnosis not present

## 2022-02-26 DIAGNOSIS — R1310 Dysphagia, unspecified: Secondary | ICD-10-CM | POA: Diagnosis not present

## 2022-02-26 DIAGNOSIS — Z8571 Personal history of Hodgkin lymphoma: Secondary | ICD-10-CM | POA: Diagnosis not present

## 2022-02-26 DIAGNOSIS — I10 Essential (primary) hypertension: Secondary | ICD-10-CM | POA: Diagnosis not present

## 2022-02-26 DIAGNOSIS — R739 Hyperglycemia, unspecified: Secondary | ICD-10-CM | POA: Diagnosis not present

## 2022-07-10 DIAGNOSIS — F419 Anxiety disorder, unspecified: Secondary | ICD-10-CM | POA: Diagnosis not present

## 2022-07-10 DIAGNOSIS — E669 Obesity, unspecified: Secondary | ICD-10-CM | POA: Diagnosis not present

## 2022-07-10 DIAGNOSIS — Z8571 Personal history of Hodgkin lymphoma: Secondary | ICD-10-CM | POA: Diagnosis not present

## 2022-07-10 DIAGNOSIS — J309 Allergic rhinitis, unspecified: Secondary | ICD-10-CM | POA: Diagnosis not present

## 2022-07-10 DIAGNOSIS — M810 Age-related osteoporosis without current pathological fracture: Secondary | ICD-10-CM | POA: Diagnosis not present

## 2022-07-10 DIAGNOSIS — I10 Essential (primary) hypertension: Secondary | ICD-10-CM | POA: Diagnosis not present

## 2022-08-29 DIAGNOSIS — Z1331 Encounter for screening for depression: Secondary | ICD-10-CM | POA: Diagnosis not present

## 2022-08-29 DIAGNOSIS — F419 Anxiety disorder, unspecified: Secondary | ICD-10-CM | POA: Diagnosis not present

## 2022-08-29 DIAGNOSIS — R002 Palpitations: Secondary | ICD-10-CM | POA: Diagnosis not present

## 2022-08-29 DIAGNOSIS — R739 Hyperglycemia, unspecified: Secondary | ICD-10-CM | POA: Diagnosis not present

## 2022-08-29 DIAGNOSIS — Z8571 Personal history of Hodgkin lymphoma: Secondary | ICD-10-CM | POA: Diagnosis not present

## 2022-08-29 DIAGNOSIS — R1310 Dysphagia, unspecified: Secondary | ICD-10-CM | POA: Diagnosis not present

## 2022-08-29 DIAGNOSIS — J302 Other seasonal allergic rhinitis: Secondary | ICD-10-CM | POA: Diagnosis not present

## 2022-08-29 DIAGNOSIS — E785 Hyperlipidemia, unspecified: Secondary | ICD-10-CM | POA: Diagnosis not present

## 2022-08-29 DIAGNOSIS — I10 Essential (primary) hypertension: Secondary | ICD-10-CM | POA: Diagnosis not present

## 2022-08-29 DIAGNOSIS — K219 Gastro-esophageal reflux disease without esophagitis: Secondary | ICD-10-CM | POA: Diagnosis not present

## 2022-08-29 DIAGNOSIS — Z23 Encounter for immunization: Secondary | ICD-10-CM | POA: Diagnosis not present

## 2022-09-03 DIAGNOSIS — M21961 Unspecified acquired deformity of right lower leg: Secondary | ICD-10-CM | POA: Diagnosis not present

## 2022-09-03 DIAGNOSIS — M2041 Other hammer toe(s) (acquired), right foot: Secondary | ICD-10-CM | POA: Diagnosis not present

## 2022-09-06 ENCOUNTER — Encounter: Payer: Self-pay | Admitting: Cardiology

## 2022-09-06 ENCOUNTER — Encounter: Payer: Self-pay | Admitting: *Deleted

## 2022-09-17 DIAGNOSIS — J302 Other seasonal allergic rhinitis: Secondary | ICD-10-CM | POA: Insufficient documentation

## 2022-09-17 DIAGNOSIS — Z79899 Other long term (current) drug therapy: Secondary | ICD-10-CM | POA: Insufficient documentation

## 2022-09-17 DIAGNOSIS — K219 Gastro-esophageal reflux disease without esophagitis: Secondary | ICD-10-CM | POA: Insufficient documentation

## 2022-09-17 DIAGNOSIS — R1310 Dysphagia, unspecified: Secondary | ICD-10-CM | POA: Insufficient documentation

## 2022-09-18 NOTE — Progress Notes (Signed)
Cardiology Office Note:    Date:  09/19/2022   ID:  Linda Tanner, DOB Dec 27, 1955, MRN [id number]  PCP:  Hurshel Party, NP  Cardiologist:  Norman Herrlich, MD    Referring MD: Hurshel Party, NP    ASSESSMENT:    1. Palpitations   2. Hypertension, essential    PLAN:    In order of problems listed above:  2 issues the first is palpitation she had a brief episode about a month ago of rapid heart rhythm no recurrence as opposed to applying ambulatory monitors for extensive periods of time I think she be best served to use her smart watch for surveillance for atrial fibrillation and capture episodes that she can send to me through MyChart she was given information on how to set up her Apple Watch also avoid over-the-counter proarrhythmic drugs Plan as described start checking ambulatory blood pressure increase her Cardizem to twice daily we will list in 2 weeks and reassess   Next appointment: 3 months   Medication Adjustments/Labs and Tests Ordered: Current medicines are reviewed at length with the patient today.  Concerns regarding medicines are outlined above.  No orders of the defined types were placed in this encounter.  No orders of the defined types were placed in this encounter.   Chief Complaint  Patient presents with   Palpitations    History of Present Illness:    Linda Tanner is a 67 y.o. female with a hx of chest pain with a normal myocardial perfusion study 2018 hypertension and Hodgkin's lymphoma history last seen 10/04/2019.  She is referred back for evaluation of palpitation.  Patient is lying recent labs with her PCP shows creatinine 0.77 GFR 85 cc/min potassium 4.4 sodium 144 TSH was normal at 1.37 cholesterol panel total 208 LDL 126 triglycerides 75 HDL 69.  Compliance with diet, lifestyle and medications: Yes  Several reasons for today's visit for send she had a brief episode of momentary rapid heart rate month ago made her concerned because her sister  has atrial fibrillation She has had no other episodes She has a smart watch but she is not wearing her I told her we can place a monitor but with the paucity of symptoms I do not think we capture anything and I asked her to go set up her Apple Watch for atrial fibrillation detection EKG captured where it and she can send strips to me through MyChart if it is recurrent The second issue is blood pressure at her office visit it was elevated And she was put on a very small dose of valsartan 40 mg once daily Her blood pressure is elevated here in the office again today but she does not check home blood pressure She has a validated device and instructed during good technique and asked her to start checking her blood pressure daily 2 weeks bring a list to the office she tolerates Cardizem very well she is taking 30 mg once daily I will ask her to take it twice daily in the interim and likely require higher dose of valsartan or combination ARB thiazide diuretic At times she has very vague nonexertional chest pain she is not having angina shortness of breath edema or syncope .  Previous normal myocardial perfusion study Past Medical History:  Diagnosis Date   Arthritis of midtarsal joint of left foot 08/11/2017   Cancer ([facility]) 1972   hodgkins   Capsulitis of metatarsophalangeal (MTP) joint of right foot 08/11/2017   Chest pain  05/27/2017   Dysphagia    GERD without esophagitis    Hodgkin's lymphoma ([facility]) 05/27/2017   1972   Hypertension, essential 06/13/2017   Left otitis media with effusion 05/27/2017   Long-term use of high-risk medication    Low back pain 05/27/2017   Medial meniscus tear 05/27/2017   Osteoarthrosis of knee 05/27/2017   Osteopenia determined by x-ray 05/22/2017   Overview:  Osteoporosis in the past but improved. Went off fosmax about 2014. 2018 BMD showed spine -2.1. Repeat BMD in 2020.    Osteoporosis 05/27/2017   Pain in right knee 05/27/2017   Seasonal allergies      Past Surgical History:  Procedure Laterality Date   CHOLECYSTECTOMY     KNEE ARTHROSCOPY Left    KNEE ARTHROSCOPY W/ SYNOVECTOMY Right 11/26/2013   with partial medial meniscectomy, extensive synovectomy, chondroplasty of the patellofemoral joint   NECK LESION BIOPSY     lump removed on gland in neck   POLYPECTOMY     SALIVARY GLAND SURGERY      Current Medications: Current Meds  Medication Sig   Calcium Carbonate Antacid (TUMS PO) Take 1 tablet by mouth as needed (heartburn).   cholecalciferol (VITAMIN D3) 25 MCG (1000 UNIT) tablet Take 1,000 Units by mouth daily.   fexofenadine (ALLEGRA) 180 MG tablet Take 180 mg by mouth daily.   hydrOXYzine (ATARAX/VISTARIL) 25 MG tablet Take 1 tablet (25 mg total) by mouth every 6 (six) hours as needed for anxiety.   omega-3 acid ethyl esters (LOVAZA) 1 g capsule Take 1 g by mouth daily.   omeprazole (PRILOSEC) 40 MG capsule Take 40 mg by mouth daily.   Potassium 99 MG TABS Take 99 mg by mouth daily.   TURMERIC PO Take 500 mg by mouth daily.    valsartan (DIOVAN) 40 MG tablet Take 40 mg by mouth daily.   Vitamin E 45 MG (100 UNIT) CAPS Take 1 capsule by mouth daily.   [DISCONTINUED] diltiazem (CARDIZEM) 30 MG tablet Take 30 mg by mouth daily.     Allergies:   Compazine [prochlorperazine edisylate], Morphine and related, and Other   Social History   Socioeconomic History   Marital status: Married    Spouse name: Not on file   Number of children: Not on file   Years of education: Not on file   Highest education level: Not on file  Occupational History   Not on file  Tobacco Use   Smoking status: Former    Types: Cigarettes   Smokeless tobacco: Never  Substance and Sexual Activity   Alcohol use: No   Drug use: No   Sexual activity: Not on file  Other Topics Concern   Not on file  Social History Narrative   Not on file   Social Determinants of Health   Financial Resource Strain: Not on file  Food Insecurity: Not on file   Transportation Needs: Not on file  Physical Activity: Not on file  Stress: Not on file  Social Connections: Not on file     Family History: The patient's family history includes Brain cancer in her father; Diabetes in her brother and paternal aunt; Hyperlipidemia in her mother; Hypertension in her mother; Ovarian cancer in her paternal aunt; Stroke in her mother; Thyroid disease in her sister. ROS:   Please see the history of present illness.    All other systems reviewed and are negative.  EKGs/Labs/Other Studies Reviewed:    The following studies were reviewed today:  EKG:  EKG ordered today and personally reviewed.  The ekg ordered today demonstrates sinus rhythm she has T wave abnormality in recall nonspecific and not different than her previous EKG   Physical Exam:    VS:  BP (!) 166/94 (BP Location: Right Arm, Patient Position: Sitting)   Pulse 67   Ht 5\' 3"  (1.6 m)   Wt 191 lb (86.6 kg)   SpO2 99%   BMI 33.83 kg/m     Wt Readings from Last 3 Encounters:  09/19/22 191 lb (86.6 kg)  08/29/22 189 lb (85.7 kg)  03/02/20 187 lb (84.8 kg)     GEN:  Well nourished, well developed in no acute distress HEENT: Normal NECK: No JVD; No carotid bruits LYMPHATICS: No lymphadenopathy CARDIAC: RRR, no murmurs, rubs, gallops RESPIRATORY:  Clear to auscultation without rales, wheezing or rhonchi  ABDOMEN: Soft, non-tender, non-distended MUSCULOSKELETAL:  No edema; No deformity  SKIN: Warm and dry NEUROLOGIC:  Alert and oriented x 3 PSYCHIATRIC:  Normal affect   Seen with Mayer Camel, CMA chaperone   Signed, Norman Herrlich, MD  09/19/2022 10:10 AM    [facility]

## 2022-09-19 ENCOUNTER — Ambulatory Visit: Payer: PPO | Attending: Cardiology | Admitting: Cardiology

## 2022-09-19 VITALS — BP 166/94 | HR 67 | Ht 63.0 in | Wt 191.0 lb

## 2022-09-19 DIAGNOSIS — I10 Essential (primary) hypertension: Secondary | ICD-10-CM

## 2022-09-19 DIAGNOSIS — R002 Palpitations: Secondary | ICD-10-CM | POA: Diagnosis not present

## 2022-09-19 MED ORDER — DILTIAZEM HCL 30 MG PO TABS: 30.0000 mg | ORAL_TABLET | Freq: Two times a day (BID) | ORAL | 3 refills | Status: DC

## 2022-09-19 NOTE — Patient Instructions (Addendum)
Medication Instructions:  Your physician has recommended you make the following change in your medication:   START: Cardizem 30 mg twice daily  *If you need a refill on your cardiac medications before your next appointment, please call your pharmacy*   Lab Work: None If you have labs (blood work) drawn today and your tests are completely normal, you will receive your results only by: [place] (if you have MyChart) OR A paper copy in the mail If you have any lab test that is abnormal or we need to change your treatment, we will call you to review the results.   Testing/Procedures: None   Follow-Up: At [facility], you and your health needs are our priority.  As part of our continuing mission to provide you with exceptional heart care, we have created designated Provider Care Teams.  These Care Teams include your primary Cardiologist (physician) and Advanced Practice Providers (APPs -  Physician Assistants and Nurse Practitioners) who all work together to provide you with the care you need, when you need it.  We recommend signing up for the patient portal called "MyChart".  Sign up information is provided on this After Visit Summary.  MyChart is used to connect with patients for Virtual Visits (Telemedicine).  Patients are able to view lab/test results, encounter notes, upcoming appointments, etc.  Non-urgent messages can be sent to your provider as well.   To learn more about what you can do with MyChart, go to [email or web address].    Your next appointment:   3 month(s)  Provider:   Shirlee More, MD  Other Instructions Check blood pressure for 2 weeks and drop off a list in the office.  This visit was accompanied by Leighton Ruff.    1. Avoid all over-the-counter antihistamines except Claritin/Loratadine and Zyrtec/Cetrizine. 2. Avoid all combination including cold sinus allergies flu decongestant and sleep medications 3. You can use Robitussin DM Mucinex  and Mucinex DM for cough. 4. can use Tylenol aspirin ibuprofen and naproxen but no combinations such as sleep or sinus. \\Healthbeat  Tips to measure your blood pressure correctly  To determine whether you have hypertension, a medical professional will take a blood pressure reading. How you prepare for the test, the position of your arm, and other factors can change a blood pressure reading by 10% or more. That could be enough to hide high blood pressure, start you on a drug you don't really need, or lead your doctor to incorrectly adjust your medications. National and international guidelines offer specific instructions for measuring blood pressure. If a doctor, nurse, or medical assistant isn't doing it right, don't hesitate to ask him or her to get with the guidelines. Here's what you can do to ensure a correct reading:  Don't drink a caffeinated beverage or smoke during the 30 minutes before the test.  Sit quietly for five minutes before the test begins.  During the measurement, sit in a chair with your feet on the floor and your arm supported so your elbow is at about heart level.  The inflatable part of the cuff should completely cover at least 80% of your upper arm, and the cuff should be placed on bare skin, not over a shirt.  Don't talk during the measurement.  Have your blood pressure measured twice, with a brief break in between. If the readings are different by 5 points or more, have it done a third time. There are times to break these rules. If you sometimes feel lightheaded when getting  out of bed in the morning or when you stand after sitting, you should have your blood pressure checked while seated and then while standing to see if it falls from one position to the next. Because blood pressure varies throughout the day, your doctor will rarely diagnose hypertension on the basis of a single reading. Instead, he or she will want to confirm the measurements on at least two occasions,  usually within a few weeks of one another. The exception to this rule is if you have a blood pressure reading of 180/110 mm Hg or higher. A result this high usually calls for prompt treatment. It's also a good idea to have your blood pressure measured in both arms at least once, since the reading in one arm (usually the right) may be higher than that in the left. A 2014 study in The American Journal of Medicine of nearly 3,400 people found average arm- to-arm differences in systolic blood pressure of about 5 points. The higher number should be used to make treatment decisions. In 2017, new guidelines from the [employer], the [employer] of Cardiology, and nine other health organizations lowered the diagnosis of high blood pressure to 130/80 mm Hg or higher for all adults. The guidelines also redefined the various blood pressure categories to now include normal, elevated, Stage 1 hypertension, Stage 2 hypertension, and hypertensive crisis (see "Blood pressure categories"). Blood pressure categories  Blood pressure category SYSTOLIC (upper number)  DIASTOLIC (lower number)  Normal Less than 120 mm Hg and Less than 80 mm Hg  Elevated 120-129 mm Hg and Less than 80 mm Hg  High blood pressure: Stage 1 hypertension 130-139 mm Hg or 80-89 mm Hg  High blood pressure: Stage 2 hypertension 140 mm Hg or higher or 90 mm Hg or higher  Hypertensive crisis (consult your doctor immediately) Higher than 180 mm Hg and/or Higher than 120 mm Hg  Source: American Heart Association and American Stroke Association. For more on getting your blood pressure under control, buy Controlling Your Blood Pressure, a Special Health Report from [facility].

## 2022-09-19 NOTE — Discharge Summary (Signed)
This visit was accompanied by Donelle Hise, CMA.  

## 2022-10-10 ENCOUNTER — Telehealth: Payer: Self-pay

## 2022-10-10 ENCOUNTER — Telehealth: Payer: Self-pay | Admitting: Cardiology

## 2022-10-10 NOTE — Telephone Encounter (Signed)
Review of ambulatory BP advised to start ARB diuretic

## 2022-10-25 ENCOUNTER — Telehealth: Payer: Self-pay | Admitting: Cardiology

## 2022-10-25 ENCOUNTER — Telehealth: Payer: Self-pay

## 2022-10-25 ENCOUNTER — Other Ambulatory Visit: Payer: Self-pay

## 2022-10-25 DIAGNOSIS — I10 Essential (primary) hypertension: Secondary | ICD-10-CM | POA: Diagnosis not present

## 2022-10-25 DIAGNOSIS — R002 Palpitations: Secondary | ICD-10-CM | POA: Diagnosis not present

## 2022-10-25 DIAGNOSIS — Z9181 History of falling: Secondary | ICD-10-CM | POA: Diagnosis not present

## 2022-10-25 MED ORDER — VALSARTAN-HYDROCHLOROTHIAZIDE 80-12.5 MG PO TABS: 1.0000 | ORAL_TABLET | Freq: Every day | ORAL | 3 refills | Status: DC

## 2022-10-25 NOTE — Telephone Encounter (Signed)
CREATED IN ERROR

## 2022-10-25 NOTE — Telephone Encounter (Signed)
Patient came by the office today stating that she had been to her PCP and her PCP had wanted her to touch base with Dr. Bettina Gavia regarding her Valsartan dose. Spoke to Dr. Bettina Gavia regarding what dose of Valsartan she should be taking. Dr. Bettina Gavia recommended Valsartan/HCTZ 80/12.5 mg daily. Called the patient and informed her of Dr. Joya Gaskins recommendation and she verbalized understanding and had no further questions at this time.

## 2022-11-25 DIAGNOSIS — I10 Essential (primary) hypertension: Secondary | ICD-10-CM | POA: Diagnosis not present

## 2022-11-25 DIAGNOSIS — R002 Palpitations: Secondary | ICD-10-CM | POA: Diagnosis not present

## 2022-11-25 DIAGNOSIS — Z139 Encounter for screening, unspecified: Secondary | ICD-10-CM | POA: Diagnosis not present

## 2022-12-30 NOTE — Progress Notes (Unsigned)
Cardiology Office Note:    Date:  12/31/2022   ID:  Linda Tanner, DOB [birth year], MRN [id number]  PCP:  Hurshel Party, NP  Cardiologist:  Norman Herrlich, MD    Referring MD: Hurshel Party, NP    ASSESSMENT:    1. Hypertension, essential   2. Palpitations    PLAN:    In order of problems listed above:  Blood pressure in office is normal.  I have cautioned her that we have seen more blood pressure variability without good technique using a validated cough but she is not resting and I have asked her to use good technique not checked first thing in the morning and bring the list in 2 weeks I suspect her blood pressure will be at target we will continue her current medical regimen. No recurrence of previous palpitation   Next appointment: 6 months   Medication Adjustments/Labs and Tests Ordered: Current medicines are reviewed at length with the patient today.  Concerns regarding medicines are outlined above.  No orders of the defined types were placed in this encounter.  No orders of the defined types were placed in this encounter.      History of Present Illness:    Linda Tanner is a 67 y.o. female with a hx of hypertension and palpitations last seen 09/19/2022  She tolerates her antihypertensive medications unfortunately is not using good technique of resting and a spot checking her blood pressure early in the morning.  She gets numbers that run in the 1 35-1 45 80-90 range And asked her to use good technique for the next 2 weeks and leave me a list of her blood pressure.  I suspect she will be at target. She is having no cardiovascular symptoms including shortness of breath chest pain palpitation or syncope    Past Medical History:  Diagnosis Date   Arthritis of midtarsal joint of left foot 08/11/2017   Cancer (HCC) 1972   hodgkins   Capsulitis of metatarsophalangeal (MTP) joint of right foot 08/11/2017   Chest pain 05/27/2017   Dysphagia    GERD without  esophagitis    Hodgkin's lymphoma (HCC) 05/27/2017   1972   Hypertension, essential 06/13/2017   Left otitis media with effusion 05/27/2017   Long-term use of high-risk medication    Low back pain 05/27/2017   Medial meniscus tear 05/27/2017   Osteoarthrosis of knee 05/27/2017   Osteopenia determined by x-ray 05/22/2017   Overview:  Osteoporosis in the past but improved. Went off fosmax about 2014. 2018 BMD showed spine -2.1. Repeat BMD in 2020.    Osteoporosis 05/27/2017   Pain in right knee 05/27/2017   Seasonal allergies     Past Surgical History:  Procedure Laterality Date   CHOLECYSTECTOMY     KNEE ARTHROSCOPY Left    KNEE ARTHROSCOPY W/ SYNOVECTOMY Right 11/26/2013   with partial medial meniscectomy, extensive synovectomy, chondroplasty of the patellofemoral joint   NECK LESION BIOPSY     lump removed on gland in neck   POLYPECTOMY     SALIVARY GLAND SURGERY      Current Medications: Current Meds  Medication Sig   Calcium Carbonate Antacid (TUMS PO) Take 1 tablet by mouth as needed (heartburn).   cholecalciferol (VITAMIN D3) 25 MCG (1000 UNIT) tablet Take 1,000 Units by mouth daily.   diltiazem (CARDIZEM) 30 MG tablet Take 1 tablet (30 mg total) by mouth 2 (two) times daily.   fexofenadine (ALLEGRA) 180 MG tablet Take 180 mg  by mouth daily.   hydrOXYzine (ATARAX/VISTARIL) 25 MG tablet Take 1 tablet (25 mg total) by mouth every 6 (six) hours as needed for anxiety.   omega-3 acid ethyl esters (LOVAZA) 1 g capsule Take 1 g by mouth daily.   Potassium 99 MG TABS Take 99 mg by mouth daily.   TURMERIC PO Take 500 mg by mouth daily.    valsartan-hydrochlorothiazide (DIOVAN HCT) 80-12.5 MG tablet Take 1 tablet by mouth daily.   Vitamin E 45 MG (100 UNIT) CAPS Take 1 capsule by mouth daily.     Allergies:   Compazine [prochlorperazine edisylate], Morphine and codeine, and Other   Social History   Socioeconomic History   Marital status: Married    Spouse name: Not on file    Number of children: Not on file   Years of education: Not on file   Highest education level: Not on file  Occupational History   Not on file  Tobacco Use   Smoking status: Former    Types: Cigarettes   Smokeless tobacco: Never  Substance and Sexual Activity   Alcohol use: No   Drug use: No   Sexual activity: Not on file  Other Topics Concern   Not on file  Social History Narrative   Not on file   Social Determinants of Health   Financial Resource Strain: Not on file  Food Insecurity: Not on file  Transportation Needs: Not on file  Physical Activity: Not on file  Stress: Not on file  Social Connections: Not on file     Family History: The patient's family history includes Brain cancer in her father; Diabetes in her brother and paternal aunt; Hyperlipidemia in her mother; Hypertension in her mother; Ovarian cancer in her paternal aunt; Stroke in her mother; Thyroid disease in her sister. ROS:   Please see the history of present illness.    All other systems reviewed and are negative.  EKGs/Labs/Other Studies Reviewed:    The following studies were reviewed today:  Cardiac Studies & Procedures     STRESS TESTS  MYOCARDIAL PERFUSION IMAGING 06/02/2017  Narrative  Nuclear stress EF: 73%.  There was no ST segment deviation noted during stress.  The study is normal.  The left ventricular ejection fraction is hyperdynamic (>65%).  1. EF 73%, normal wall motion. 2. No significant perfusion defect, no evidence for ischemia or infarction.  Normal study.   ECHOCARDIOGRAM  ECHOCARDIOGRAM COMPLETE 03/24/2020  Narrative ECHOCARDIOGRAM REPORT    Patient Name:   Linda Tanner Date of Exam: 03/24/2020 Medical Rec #:  [id number]         Height:       64.0 in Accession #:    [id number]        Weight:       187.0 lb Date of Birth:  19-Sep-1955        BSA:          1.901 m Patient Age:    67 years          BP:           126/84 mmHg Patient Gender: F                  HR:           61 bpm. Exam Location:    Procedure: 2D Echo  Indications:    Shortness of breath [786.05.ICD-9-CM]  History:        Patient has no prior history of Echocardiogram examinations.  History of Hodgkin's lymphoma, Signs/Symptoms:Chest Pain; Risk Factors:Hypertension.  Sonographer:    Louie Boston Referring Phys: [id number] Leilani Cespedes J Azaylia Fong  IMPRESSIONS   1. Left ventricular ejection fraction, by estimation, is 60 to 65%. The left ventricle has normal function. The left ventricle has no regional wall motion abnormalities. Left ventricular diastolic parameters are consistent with Grade I diastolic dysfunction (impaired relaxation).  FINDINGS Left Ventricle: Left ventricular ejection fraction, by estimation, is 60 to 65%. The left ventricle has normal function. The left ventricle has no regional wall motion abnormalities. The left ventricular internal cavity size was normal in size. There is no left ventricular hypertrophy. Left ventricular diastolic parameters are consistent with Grade I diastolic dysfunction (impaired relaxation).  Right Ventricle: The right ventricular size is normal. No increase in right ventricular wall thickness. Right ventricular systolic function is normal. There is normal pulmonary artery systolic pressure. The tricuspid regurgitant velocity is 1.21 m/s, and with an assumed right atrial pressure of 3 mmHg, the estimated right ventricular systolic pressure is 8.9 mmHg.  Left Atrium: Left atrial size was normal in size.  Right Atrium: Right atrial size was normal in size.  Pericardium: There is no evidence of pericardial effusion.  Mitral Valve: The mitral valve is normal in structure. Normal mobility of the mitral valve leaflets. No evidence of mitral valve regurgitation. No evidence of mitral valve stenosis.  Tricuspid Valve: The tricuspid valve is normal in structure. Tricuspid valve regurgitation is mild . No evidence of tricuspid  stenosis.  Aortic Valve: The aortic valve is normal in structure. Aortic valve regurgitation is not visualized. No aortic stenosis is present.  Pulmonic Valve: The pulmonic valve was normal in structure. Pulmonic valve regurgitation is not visualized. No evidence of pulmonic stenosis.  Aorta: The aortic root is normal in size and structure.  Venous: The inferior vena cava is normal in size with greater than 50% respiratory variability, suggesting right atrial pressure of 3 mmHg.  IAS/Shunts: No atrial level shunt detected by color flow Doppler.   LEFT VENTRICLE PLAX 2D LVIDd:         4.80 cm  Diastology LVIDs:         2.30 cm  LV e' lateral:   7.07 cm/s LV PW:         1.10 cm  LV E/e' lateral: 11.1 LV IVS:        1.10 cm  LV e' medial:    7.72 cm/s LVOT diam:     1.70 cm  LV E/e' medial:  10.2 LV SV:         43 LV SV Index:   23 LVOT Area:     2.27 cm   RIGHT VENTRICLE             IVC RV S prime:     12.90 cm/s  IVC diam: 2.10 cm TAPSE (M-mode): 2.7 cm  LEFT ATRIUM             Index       RIGHT ATRIUM           Index LA diam:        3.20 cm 1.68 cm/m  RA Area:     11.00 cm LA Vol (A2C):   46.7 ml 24.56 ml/m RA Volume:   20.80 ml  10.94 ml/m LA Vol (A4C):   31.2 ml 16.41 ml/m LA Biplane Vol: 39.4 ml 20.72 ml/m AORTIC VALVE LVOT Vmax:   86.50 cm/s LVOT Vmean:  54.800 cm/s LVOT VTI:  0.190 m  AORTA Ao Root diam: 2.70 cm Ao Asc diam:  3.40 cm  MITRAL VALVE               TRICUSPID VALVE MV Area (PHT): 2.99 cm    TR Peak grad:   5.9 mmHg MV Decel Time: 254 msec    TR Vmax:        121.00 cm/s MV E velocity: 78.60 cm/s MV A velocity: 86.30 cm/s  SHUNTS MV E/A ratio:  0.91        Systemic VTI:  0.19 m Systemic Diam: 1.70 cm  Belva Crome MD Electronically signed by Belva Crome MD Signature Date/Time: 03/24/2020/4:49:42 PM    Final            08/29/2022 cholesterol 208 LDL 126 A1c 6.1 hemoglobin 13.7 10/25/2022 creatinine 0.89 potassium 4.6  Physical  Exam:    VS:  BP 126/78 (BP Location: Left Arm, Patient Position: Sitting)   Pulse 68   Ht 5\' 3"  (1.6 m)   Wt 191 lb (86.6 kg)   SpO2 98%   BMI 33.83 kg/m     Wt Readings from Last 3 Encounters:  12/31/22 191 lb (86.6 kg)  09/19/22 191 lb (86.6 kg)  08/29/22 189 lb (85.7 kg)     GEN:  Well nourished, well developed in no acute distress HEENT: Normal NECK: No JVD; No carotid bruits LYMPHATICS: No lymphadenopathy CARDIAC: RRR, no murmurs, rubs, gallops RESPIRATORY:  Clear to auscultation without rales, wheezing or rhonchi  ABDOMEN: Soft, non-tender, non-distended MUSCULOSKELETAL:  No edema; No deformity  SKIN: Warm and dry NEUROLOGIC:  Alert and oriented x 3 PSYCHIATRIC:  Normal affect    Signed, Norman Herrlich, MD  12/31/2022 10:37 AM    [facility]

## 2022-12-31 ENCOUNTER — Ambulatory Visit: Payer: PPO | Attending: Cardiology | Admitting: Cardiology

## 2022-12-31 ENCOUNTER — Encounter: Payer: Self-pay | Admitting: Cardiology

## 2022-12-31 VITALS — BP 126/78 | HR 68 | Ht 63.0 in | Wt 191.0 lb

## 2022-12-31 DIAGNOSIS — I10 Essential (primary) hypertension: Secondary | ICD-10-CM

## 2022-12-31 DIAGNOSIS — R002 Palpitations: Secondary | ICD-10-CM | POA: Diagnosis not present

## 2022-12-31 NOTE — Patient Instructions (Addendum)
Medication Instructions:  Your physician recommends that you continue on your current medications as directed. Please refer to the Current Medication list given to you today.  *If you need a refill on your cardiac medications before your next appointment, please call your pharmacy*   Lab Work: None If you have labs (blood work) drawn today and your tests are completely normal, you will receive your results only by: MyChart Message (if you have MyChart) OR A paper copy in the mail If you have any lab test that is abnormal or we need to change your treatment, we will call you to review the results.   Testing/Procedures: None   Follow-Up: At [facility], you and your health needs are our priority.  As part of our continuing mission to provide you with exceptional heart care, we have created designated Provider Care Teams.  These Care Teams include your primary Cardiologist (physician) and Advanced Practice Providers (APPs -  Physician Assistants and Nurse Practitioners) who all work together to provide you with the care you need, when you need it.  We recommend signing up for the patient portal called "MyChart".  Sign up information is provided on this After Visit Summary.  MyChart is used to connect with patients for Virtual Visits (Telemedicine).  Patients are able to view lab/test results, encounter notes, upcoming appointments, etc.  Non-urgent messages can be sent to your provider as well.   To learn more about what you can do with MyChart, go to [email or web address].    Your next appointment:   6 month(s)  Provider:   Norman Herrlich, MD    Other Instructions None  Healthbeat  Tips to measure your blood pressure correctly  To determine whether you have hypertension, a medical professional will take a blood pressure reading. How you prepare for the test, the position of your arm, and other factors can change a blood pressure reading by 10% or more. That could be enough  to hide high blood pressure, start you on a drug you don't really need, or lead your doctor to incorrectly adjust your medications. National and international guidelines offer specific instructions for measuring blood pressure. If a doctor, nurse, or medical assistant isn't doing it right, don't hesitate to ask him or her to get with the guidelines. Here's what you can do to ensure a correct reading:  Don't drink a caffeinated beverage or smoke during the 30 minutes before the test.  Sit quietly for five minutes before the test begins.  During the measurement, sit in a chair with your feet on the floor and your arm supported so your elbow is at about heart level.  The inflatable part of the cuff should completely cover at least 80% of your upper arm, and the cuff should be placed on bare skin, not over a shirt.  Don't talk during the measurement.  Have your blood pressure measured twice, with a brief break in between. If the readings are different by 5 points or more, have it done a third time. There are times to break these rules. If you sometimes feel lightheaded when getting out of bed in the morning or when you stand after sitting, you should have your blood pressure checked while seated and then while standing to see if it falls from one position to the next. Because blood pressure varies throughout the day, your doctor will rarely diagnose hypertension on the basis of a single reading. Instead, he or she will want to confirm the measurements on at  least two occasions, usually within a few weeks of one another. The exception to this rule is if you have a blood pressure reading of 180/110 mm Hg or higher. A result this high usually calls for prompt treatment. It's also a good idea to have your blood pressure measured in both arms at least once, since the reading in one arm (usually the right) may be higher than that in the left. A 2014 study in The American Journal of Medicine of nearly 3,400 people  found average arm- to-arm differences in systolic blood pressure of about 5 points. The higher number should be used to make treatment decisions. In 2017, new guidelines from the American Heart Association, the [employer] of Cardiology, and nine other health organizations lowered the diagnosis of high blood pressure to 130/80 mm Hg or higher for all adults. The guidelines also redefined the various blood pressure categories to now include normal, elevated, Stage 1 hypertension, Stage 2 hypertension, and hypertensive crisis (see "Blood pressure categories"). Blood pressure categories  Blood pressure category SYSTOLIC (upper number)  DIASTOLIC (lower number)  Normal Less than 120 mm Hg and Less than 80 mm Hg  Elevated 120-129 mm Hg and Less than 80 mm Hg  High blood pressure: Stage 1 hypertension 130-139 mm Hg or 80-89 mm Hg  High blood pressure: Stage 2 hypertension 140 mm Hg or higher or 90 mm Hg or higher  Hypertensive crisis (consult your doctor immediately) Higher than 180 mm Hg and/or Higher than 120 mm Hg  Source: American Heart Association and American Stroke Association. For more on getting your blood pressure under control, buy Controlling Your Blood Pressure, a Special Health Report from [facility].

## 2023-03-06 DIAGNOSIS — R739 Hyperglycemia, unspecified: Secondary | ICD-10-CM | POA: Diagnosis not present

## 2023-03-06 DIAGNOSIS — Z8571 Personal history of Hodgkin lymphoma: Secondary | ICD-10-CM | POA: Diagnosis not present

## 2023-03-06 DIAGNOSIS — K219 Gastro-esophageal reflux disease without esophagitis: Secondary | ICD-10-CM | POA: Diagnosis not present

## 2023-03-06 DIAGNOSIS — F419 Anxiety disorder, unspecified: Secondary | ICD-10-CM | POA: Diagnosis not present

## 2023-03-06 DIAGNOSIS — E785 Hyperlipidemia, unspecified: Secondary | ICD-10-CM | POA: Diagnosis not present

## 2023-03-06 DIAGNOSIS — J302 Other seasonal allergic rhinitis: Secondary | ICD-10-CM | POA: Diagnosis not present

## 2023-03-06 DIAGNOSIS — R1310 Dysphagia, unspecified: Secondary | ICD-10-CM | POA: Diagnosis not present

## 2023-03-06 DIAGNOSIS — I10 Essential (primary) hypertension: Secondary | ICD-10-CM | POA: Diagnosis not present

## 2023-03-06 DIAGNOSIS — R002 Palpitations: Secondary | ICD-10-CM | POA: Diagnosis not present

## 2023-03-06 DIAGNOSIS — M25561 Pain in right knee: Secondary | ICD-10-CM | POA: Diagnosis not present

## 2023-03-26 DIAGNOSIS — M25561 Pain in right knee: Secondary | ICD-10-CM | POA: Diagnosis not present

## 2023-03-26 DIAGNOSIS — G8929 Other chronic pain: Secondary | ICD-10-CM | POA: Diagnosis not present

## 2023-03-26 DIAGNOSIS — M1611 Unilateral primary osteoarthritis, right hip: Secondary | ICD-10-CM | POA: Diagnosis not present

## 2023-03-26 DIAGNOSIS — M25551 Pain in right hip: Secondary | ICD-10-CM | POA: Diagnosis not present

## 2023-03-26 DIAGNOSIS — M1711 Unilateral primary osteoarthritis, right knee: Secondary | ICD-10-CM | POA: Diagnosis not present

## 2023-05-22 DIAGNOSIS — Z87891 Personal history of nicotine dependence: Secondary | ICD-10-CM | POA: Diagnosis not present

## 2023-05-22 DIAGNOSIS — J309 Allergic rhinitis, unspecified: Secondary | ICD-10-CM | POA: Diagnosis not present

## 2023-05-22 DIAGNOSIS — F419 Anxiety disorder, unspecified: Secondary | ICD-10-CM | POA: Diagnosis not present

## 2023-05-22 DIAGNOSIS — M1990 Unspecified osteoarthritis, unspecified site: Secondary | ICD-10-CM | POA: Diagnosis not present

## 2023-05-22 DIAGNOSIS — E669 Obesity, unspecified: Secondary | ICD-10-CM | POA: Diagnosis not present

## 2023-05-22 DIAGNOSIS — I10 Essential (primary) hypertension: Secondary | ICD-10-CM | POA: Diagnosis not present

## 2023-05-22 DIAGNOSIS — M810 Age-related osteoporosis without current pathological fracture: Secondary | ICD-10-CM | POA: Diagnosis not present

## 2023-05-22 DIAGNOSIS — G8929 Other chronic pain: Secondary | ICD-10-CM | POA: Diagnosis not present

## 2023-08-25 DIAGNOSIS — Z0000 Encounter for general adult medical examination without abnormal findings: Secondary | ICD-10-CM | POA: Diagnosis not present

## 2023-08-25 DIAGNOSIS — Z9181 History of falling: Secondary | ICD-10-CM | POA: Diagnosis not present

## 2023-09-23 DIAGNOSIS — R002 Palpitations: Secondary | ICD-10-CM | POA: Diagnosis not present

## 2023-09-23 DIAGNOSIS — I10 Essential (primary) hypertension: Secondary | ICD-10-CM | POA: Diagnosis not present

## 2023-09-23 DIAGNOSIS — E785 Hyperlipidemia, unspecified: Secondary | ICD-10-CM | POA: Diagnosis not present

## 2023-09-23 DIAGNOSIS — Z6832 Body mass index (BMI) 32.0-32.9, adult: Secondary | ICD-10-CM | POA: Diagnosis not present

## 2023-09-23 DIAGNOSIS — J302 Other seasonal allergic rhinitis: Secondary | ICD-10-CM | POA: Diagnosis not present

## 2023-09-23 DIAGNOSIS — Z8571 Personal history of Hodgkin lymphoma: Secondary | ICD-10-CM | POA: Diagnosis not present

## 2023-09-23 DIAGNOSIS — R7303 Prediabetes: Secondary | ICD-10-CM | POA: Diagnosis not present

## 2023-09-23 DIAGNOSIS — M79602 Pain in left arm: Secondary | ICD-10-CM | POA: Diagnosis not present

## 2023-09-23 DIAGNOSIS — F419 Anxiety disorder, unspecified: Secondary | ICD-10-CM | POA: Diagnosis not present

## 2023-09-23 DIAGNOSIS — R739 Hyperglycemia, unspecified: Secondary | ICD-10-CM | POA: Diagnosis not present

## 2023-10-07 DIAGNOSIS — M1711 Unilateral primary osteoarthritis, right knee: Secondary | ICD-10-CM | POA: Diagnosis not present

## 2023-10-23 ENCOUNTER — Other Ambulatory Visit: Payer: Self-pay | Admitting: Cardiology

## 2024-03-23 DIAGNOSIS — E785 Hyperlipidemia, unspecified: Secondary | ICD-10-CM | POA: Diagnosis not present

## 2024-03-23 DIAGNOSIS — I10 Essential (primary) hypertension: Secondary | ICD-10-CM | POA: Diagnosis not present

## 2024-03-23 DIAGNOSIS — M542 Cervicalgia: Secondary | ICD-10-CM | POA: Diagnosis not present

## 2024-03-23 DIAGNOSIS — R002 Palpitations: Secondary | ICD-10-CM | POA: Diagnosis not present

## 2024-03-23 DIAGNOSIS — J302 Other seasonal allergic rhinitis: Secondary | ICD-10-CM | POA: Diagnosis not present

## 2024-03-23 DIAGNOSIS — Z6832 Body mass index (BMI) 32.0-32.9, adult: Secondary | ICD-10-CM | POA: Diagnosis not present

## 2024-03-23 DIAGNOSIS — R7303 Prediabetes: Secondary | ICD-10-CM | POA: Diagnosis not present

## 2024-03-23 DIAGNOSIS — Z8571 Personal history of Hodgkin lymphoma: Secondary | ICD-10-CM | POA: Diagnosis not present

## 2024-03-23 DIAGNOSIS — F419 Anxiety disorder, unspecified: Secondary | ICD-10-CM | POA: Diagnosis not present

## 2024-03-23 DIAGNOSIS — K219 Gastro-esophageal reflux disease without esophagitis: Secondary | ICD-10-CM | POA: Diagnosis not present

## 2024-06-01 DIAGNOSIS — M1711 Unilateral primary osteoarthritis, right knee: Secondary | ICD-10-CM | POA: Diagnosis not present

## 2024-06-02 NOTE — Progress Notes (Unsigned)
 Cardiology Office Note:    Date:  06/03/2024   ID:  Linda Tanner, DOB 1955-12-02, MRN [id number]  PCP:  Linda Greig LABOR, NP  Cardiologist:  Linda Leiter, MD    Referring MD: Linda Greig LABOR, NP    ASSESSMENT:    1. Hypertension, essential   2. Palpitations   3. Mixed hyperlipidemia    PLAN:    In order of problems listed above:  Stable BP is at target continue current treatment ARB thiazide diuretic and diltiazem  Stable continue diltiazem  send affect if limiting palpitation I have asked her to set up her Apple watch for self-management Currently not treated has not except a statin I offered her coronary calcium score for decision making she declines   Next appointment: 1 year   Medication Adjustments/Labs and Tests Ordered: Current medicines are reviewed at length with the patient today.  Concerns regarding medicines are outlined above.  Orders Placed This Encounter  Procedures   EKG 12-Lead   Meds ordered this encounter  Medications   valsartan -hydrochlorothiazide  (DIOVAN -HCT) 80-12.5 MG tablet    Sig: Take 1 tablet by mouth daily.    Dispense:  90 tablet    Refill:  3   diltiazem  (CARDIZEM ) 30 MG tablet    Sig: Take 1 tablet (30 mg total) by mouth 2 (two) times daily.    Dispense:  180 tablet    Refill:  3     History of Present Illness:    Linda Tanner is a 68 y.o. female with a hx of hypertension and palpitation last seen 12/31/2022.  Compliance with diet, lifestyle and medications: She has had an uneventful year.  At times she will get momentary palpitation not severe and not sustained.  She has a smart watch but is not using it.  Will give her handout for the watch set up.  She is not checking her home blood pressure asked her to get in the habit of doing on a regular basis.  I gave her instructions in good technique Not having chest pain edema shortness of breath or syncope Recent labs 03/23/2024 cholesterol 215 LDL 130 glycerides 86 non-HDL  cholesterol 146 Past Medical History:  Diagnosis Date   Arthritis of midtarsal joint of left foot 08/11/2017   Cancer (HCC) 1972   hodgkins   Capsulitis of metatarsophalangeal (MTP) joint of right foot 08/11/2017   Chest pain 05/27/2017   Dysphagia    GERD without esophagitis    Hodgkin's lymphoma (HCC) 05/27/2017   1972   Hypertension, essential 06/13/2017   Left otitis media with effusion 05/27/2017   Long-term use of high-risk medication    Low back pain 05/27/2017   Medial meniscus tear 05/27/2017   Osteoarthrosis of knee 05/27/2017   Osteopenia determined by x-ray 05/22/2017   Overview:  Osteoporosis in the past but improved. Went off fosmax about 2014. 2018 BMD showed spine -2.1. Repeat BMD in 2020.    Osteoporosis 05/27/2017   Pain in right knee 05/27/2017   Seasonal allergies     Current Medications: Current Meds  Medication Sig   amoxicillin (AMOXIL) 500 MG capsule Take 500 mg by mouth 2 (two) times daily.   Calcium Carbonate Antacid (TUMS PO) Take 1 tablet by mouth as needed (heartburn).   cholecalciferol (VITAMIN D3) 25 MCG (1000 UNIT) tablet Take 1,000 Units by mouth daily.   fexofenadine (ALLEGRA) 180 MG tablet Take 180 mg by mouth daily.   hydrOXYzine  (ATARAX /VISTARIL ) 25 MG tablet Take 1 tablet (25 mg total)  by mouth every 6 (six) hours as needed for anxiety.   omega-3 acid ethyl esters (LOVAZA) 1 g capsule Take 1 g by mouth daily.   Potassium 99 MG TABS Take 99 mg by mouth daily.   predniSONE (DELTASONE) 20 MG tablet Take 20 mg by mouth 2 (two) times daily.   TURMERIC PO Take 500 mg by mouth daily.    Vitamin E 45 MG (100 UNIT) CAPS Take 1 capsule by mouth daily.   [DISCONTINUED] diltiazem  (CARDIZEM ) 30 MG tablet Take 1 tablet (30 mg total) by mouth 2 (two) times daily.   [DISCONTINUED] valsartan -hydrochlorothiazide  (DIOVAN -HCT) 80-12.5 MG tablet TAKE 1 TABLET BY MOUTH EVERY DAY      EKGs/Labs/Other Studies Reviewed:    The following studies were reviewed  today:  Cardiac Studies & Procedures   ______________________________________________________________________________________________   STRESS TESTS  MYOCARDIAL PERFUSION IMAGING 06/02/2017  Interpretation Summary  Nuclear stress EF: 73%.  There was no ST segment deviation noted during stress.  The study is normal.  The left ventricular ejection fraction is hyperdynamic (>65%).  1. EF 73%, normal wall motion. 2. No significant perfusion defect, no evidence for ischemia or infarction.  Normal study.   ECHOCARDIOGRAM  ECHOCARDIOGRAM COMPLETE 03/24/2020  Narrative ECHOCARDIOGRAM REPORT    Patient Name:   Linda Tanner Date of Exam: 03/24/2020 Medical Rec #:  [id number]         Height:       64.0 in Accession #:    [id number]        Weight:       187.0 lb Date of Birth:  11-Sep-1955        BSA:          1.901 m Patient Age:    63 years          BP:           126/84 mmHg Patient Gender: F                 HR:           61 bpm. Exam Location:  [place]  Procedure: 2D Echo  Indications:    Shortness of breath [786.05.ICD-9-CM]  History:        Patient has no prior history of Echocardiogram examinations. History of Hodgkin's lymphoma, Signs/Symptoms:Chest Pain; Risk Factors:Hypertension.  Sonographer:    Lynwood Silvas Referring Phys: [phone] Linda Tanner  IMPRESSIONS   1. Left ventricular ejection fraction, by estimation, is 60 to 65%. The left ventricle has normal function. The left ventricle has no regional wall motion abnormalities. Left ventricular diastolic parameters are consistent with Grade I diastolic dysfunction (impaired relaxation).  FINDINGS Left Ventricle: Left ventricular ejection fraction, by estimation, is 60 to 65%. The left ventricle has normal function. The left ventricle has no regional wall motion abnormalities. The left ventricular internal cavity size was normal in size. There is no left ventricular hypertrophy. Left ventricular diastolic  parameters are consistent with Grade I diastolic dysfunction (impaired relaxation).  Right Ventricle: The right ventricular size is normal. No increase in right ventricular wall thickness. Right ventricular systolic function is normal. There is normal pulmonary artery systolic pressure. The tricuspid regurgitant velocity is 1.21 m/s, and with an assumed right atrial pressure of 3 mmHg, the estimated right ventricular systolic pressure is 8.9 mmHg.  Left Atrium: Left atrial size was normal in size.  Right Atrium: Right atrial size was normal in size.  Pericardium: There is no evidence of pericardial effusion.  Mitral  Valve: The mitral valve is normal in structure. Normal mobility of the mitral valve leaflets. No evidence of mitral valve regurgitation. No evidence of mitral valve stenosis.  Tricuspid Valve: The tricuspid valve is normal in structure. Tricuspid valve regurgitation is mild . No evidence of tricuspid stenosis.  Aortic Valve: The aortic valve is normal in structure. Aortic valve regurgitation is not visualized. No aortic stenosis is present.  Pulmonic Valve: The pulmonic valve was normal in structure. Pulmonic valve regurgitation is not visualized. No evidence of pulmonic stenosis.  Aorta: The aortic root is normal in size and structure.  Venous: The inferior vena cava is normal in size with greater than 50% respiratory variability, suggesting right atrial pressure of 3 mmHg.  IAS/Shunts: No atrial level shunt detected by color flow Doppler.   LEFT VENTRICLE PLAX 2D LVIDd:         4.80 cm  Diastology LVIDs:         2.30 cm  LV e' lateral:   7.07 cm/s LV PW:         1.10 cm  LV E/e' lateral: 11.1 LV IVS:        1.10 cm  LV e' medial:    7.72 cm/s LVOT diam:     1.70 cm  LV E/e' medial:  10.2 LV SV:         43 LV SV Index:   23 LVOT Area:     2.27 cm   RIGHT VENTRICLE             IVC RV S prime:     12.90 cm/s  IVC diam: 2.10 cm TAPSE (M-mode): 2.7 cm  LEFT ATRIUM              Index       RIGHT ATRIUM           Index LA diam:        3.20 cm 1.68 cm/m  RA Area:     11.00 cm LA Vol (A2C):   46.7 ml 24.56 ml/m RA Volume:   20.80 ml  10.94 ml/m LA Vol (A4C):   31.2 ml 16.41 ml/m LA Biplane Vol: 39.4 ml 20.72 ml/m AORTIC VALVE LVOT Vmax:   86.50 cm/s LVOT Vmean:  54.800 cm/s LVOT VTI:    0.190 m  AORTA Ao Root diam: 2.70 cm Ao Asc diam:  3.40 cm  MITRAL VALVE               TRICUSPID VALVE MV Area (PHT): 2.99 cm    TR Peak grad:   5.9 mmHg MV Decel Time: 254 msec    TR Vmax:        121.00 cm/s MV E velocity: 78.60 cm/s MV A velocity: 86.30 cm/s  SHUNTS MV E/A ratio:  0.91        Systemic VTI:  0.19 m Systemic Diam: 1.70 cm  Jennifer Crape MD Electronically signed by Jennifer Crape MD Signature Date/Time: 03/24/2020/4:49:42 PM    Final          ______________________________________________________________________________________________      EKG Interpretation Date/Time:  Thursday June 03 2024 15:54:12 EDT Ventricular Rate:  63 PR Interval:  154 QRS Duration:  78 QT Interval:  412 QTC Calculation: 421 R Axis:   38  Text Interpretation: Normal sinus rhythm Normal ECG When compared with ECG of 22-May-2017 19:54, No significant change was found Confirmed by Monetta Rogue ([id number]) on 06/03/2024 4:09:11 PM     Physical Exam:  VS:  BP (!) 146/84   Pulse 63   Ht 5' 4 (1.626 m)   Wt 177 lb 6.4 oz (80.5 kg)   SpO2 99%   BMI 30.45 kg/m     Wt Readings from Last 3 Encounters:  06/03/24 177 lb 6.4 oz (80.5 kg)  12/31/22 191 lb (86.6 kg)  09/19/22 191 lb (86.6 kg)    Repeat by me 09/08/1976 GEN:  Well nourished, well developed in no acute distress HEENT: Normal NECK: No JVD; No carotid bruits LYMPHATICS: No lymphadenopathy CARDIAC: RRR, no murmurs, rubs, gallops RESPIRATORY:  Clear to auscultation without rales, wheezing or rhonchi  ABDOMEN: Soft, non-tender, non-distended MUSCULOSKELETAL:  No edema; No deformity   SKIN: Warm and dry NEUROLOGIC:  Alert and oriented x 3 PSYCHIATRIC:  Normal affect    Signed, Linda Leiter, MD  06/03/2024 4:35 PM    [facility]

## 2024-06-03 ENCOUNTER — Ambulatory Visit: Attending: Cardiology | Admitting: Cardiology

## 2024-06-03 ENCOUNTER — Encounter: Payer: Self-pay | Admitting: Cardiology

## 2024-06-03 VITALS — BP 146/84 | HR 63 | Ht 64.0 in | Wt 177.4 lb

## 2024-06-03 DIAGNOSIS — I10 Essential (primary) hypertension: Secondary | ICD-10-CM

## 2024-06-03 DIAGNOSIS — E782 Mixed hyperlipidemia: Secondary | ICD-10-CM

## 2024-06-03 DIAGNOSIS — R002 Palpitations: Secondary | ICD-10-CM | POA: Diagnosis not present

## 2024-06-03 MED ORDER — VALSARTAN-HYDROCHLOROTHIAZIDE 80-12.5 MG PO TABS: 1.0000 | ORAL_TABLET | Freq: Every day | ORAL | 3 refills | Status: AC

## 2024-06-03 MED ORDER — DILTIAZEM HCL 30 MG PO TABS: 30.0000 mg | ORAL_TABLET | Freq: Two times a day (BID) | ORAL | 3 refills | Status: AC

## 2024-06-03 NOTE — Patient Instructions (Addendum)
 Medication Instructions:  Your physician recommends that you continue on your current medications as directed. Please refer to the Current Medication list given to you today.  *If you need a refill on your cardiac medications before your next appointment, please call your pharmacy*   Lab Work: None ordered If you have labs (blood work) drawn today and your tests are completely normal, you will receive your results only by: MyChart Message (if you have MyChart) OR A paper copy in the mail If you have any lab test that is abnormal or we need to change your treatment, we will call you to review the results.   Testing/Procedures: None ordered   Follow-Up: At [facility], you and your health needs are our priority.  As part of our continuing mission to provide you with exceptional heart care, we have created designated Provider Care Teams.  These Care Teams include your primary Cardiologist (physician) and Advanced Practice Providers (APPs -  Physician Assistants and Nurse Practitioners) who all work together to provide you with the care you need, when you need it.  We recommend signing up for the patient portal called MyChart.  Sign up information is provided on this After Visit Summary.  MyChart is used to connect with patients for Virtual Visits (Telemedicine).  Patients are able to view lab/test results, encounter notes, upcoming appointments, etc.  Non-urgent messages can be sent to your provider as well.   To learn more about what you can do with MyChart, go to [email or web address].    Your next appointment:   12 month(s)  The format for your next appointment:   In Person  Provider:   Redell Leiter, MD    Other Instructions none  Important Information About Sugar     [employer]  Tips to measure your blood pressure correctly  To determine whether you have hypertension, a medical professional will take a blood pressure reading. How you prepare for the test,  the position of your arm, and other factors can change a blood pressure reading by 10% or more. That could be enough to hide high blood pressure, start you on a drug you don't really need, or lead your doctor to incorrectly adjust your medications. National and international guidelines offer specific instructions for measuring blood pressure. If a doctor, nurse, or medical assistant isn't doing it right, don't hesitate to ask him or her to get with the guidelines. Here's what you can do to ensure a correct reading:  Don't drink a caffeinated beverage or smoke during the 30 minutes before the test.  Sit quietly for five minutes before the test begins.  During the measurement, sit in a chair with your feet on the floor and your arm supported so your elbow is at about heart level.  The inflatable part of the cuff should completely cover at least 80% of your upper arm, and the cuff should be placed on bare skin, not over a shirt.  Don't talk during the measurement.  Have your blood pressure measured twice, with a brief break in between. If the readings are different by 5 points or more, have it done a third time. There are times to break these rules. If you sometimes feel lightheaded when getting out of bed in the morning or when you stand after sitting, you should have your blood pressure checked while seated and then while standing to see if it falls from one position to the next. Because blood pressure varies throughout the day, your doctor will rarely  diagnose hypertension on the basis of a single reading. Instead, he or she will want to confirm the measurements on at least two occasions, usually within a few weeks of one another. The exception to this rule is if you have a blood pressure reading of 180/110 mm Hg or higher. A result this high usually calls for prompt treatment. It's also a good idea to have your blood pressure measured in both arms at least once, since the reading in one arm (usually the  right) may be higher than that in the left. A 2014 study in The American Journal of Medicine of nearly 3,400 people found average arm- to-arm differences in systolic blood pressure of about 5 points. The higher number should be used to make treatment decisions. In 2017, new guidelines from the American Heart Association, the [employer] of Cardiology, and nine other health organizations lowered the diagnosis of high blood pressure to 130/80 mm Hg or higher for all adults. The guidelines also redefined the various blood pressure categories to now include normal, elevated, Stage 1 hypertension, Stage 2 hypertension, and hypertensive crisis (see Blood pressure categories). Blood pressure categories  Blood pressure category SYSTOLIC (upper number)  DIASTOLIC (lower number)  Normal Less than 120 mm Hg and Less than 80 mm Hg  Elevated 120-129 mm Hg and Less than 80 mm Hg  High blood pressure: Stage 1 hypertension 130-139 mm Hg or 80-89 mm Hg  High blood pressure: Stage 2 hypertension 140 mm Hg or higher or 90 mm Hg or higher  Hypertensive crisis (consult your doctor immediately) Higher than 180 mm Hg and/or Higher than 120 mm Hg  Source: American Heart Association and American Stroke Association. For more on getting your blood pressure under control, buy Controlling Your Blood Pressure, a Special Health Report from [facility].
